# Patient Record
Sex: Male | Born: 1960
Health system: Southern US, Community
[De-identification: ages and names within clinical notes are randomized; demographics above are authoritative.]

## PROBLEM LIST (undated history)

## (undated) DIAGNOSIS — E119 Type 2 diabetes mellitus without complications: Secondary | ICD-10-CM

## (undated) DIAGNOSIS — K219 Gastro-esophageal reflux disease without esophagitis: Secondary | ICD-10-CM

## (undated) DIAGNOSIS — F419 Anxiety disorder, unspecified: Secondary | ICD-10-CM

## (undated) DIAGNOSIS — G20A1 Parkinson's disease without dyskinesia, without mention of fluctuations: Secondary | ICD-10-CM

## (undated) HISTORY — PX: COLONOSCOPY: SHX174

## (undated) HISTORY — PX: LASIK: SHX215

## (undated) HISTORY — DX: Type 2 diabetes mellitus without complications: E11.9

## (undated) HISTORY — PX: WISDOM TOOTH EXTRACTION: SHX21

---

## 2000-08-16 ENCOUNTER — Encounter: Admission: RE | Admit: 2000-08-16 | Discharge: 2000-11-14 | Payer: Self-pay | Admitting: Internal Medicine

## 2004-12-22 ENCOUNTER — Encounter: Admission: RE | Admit: 2004-12-22 | Discharge: 2004-12-22 | Payer: Self-pay | Admitting: Orthopedic Surgery

## 2007-10-12 ENCOUNTER — Encounter: Admission: RE | Admit: 2007-10-12 | Discharge: 2007-10-12 | Payer: Self-pay | Admitting: Internal Medicine

## 2014-01-31 ENCOUNTER — Other Ambulatory Visit: Payer: Self-pay | Admitting: Internal Medicine

## 2014-01-31 ENCOUNTER — Ambulatory Visit
Admission: RE | Admit: 2014-01-31 | Discharge: 2014-01-31 | Disposition: A | Discharge status: Home / Self Care | Payer: BC Managed Care – PPO | Source: Ambulatory Visit | Attending: Internal Medicine | Admitting: Internal Medicine

## 2014-01-31 DIAGNOSIS — M549 Dorsalgia, unspecified: Secondary | ICD-10-CM

## 2015-05-17 ENCOUNTER — Other Ambulatory Visit: Payer: Self-pay | Admitting: Internal Medicine

## 2015-05-17 ENCOUNTER — Ambulatory Visit
Admission: RE | Admit: 2015-05-17 | Discharge: 2015-05-17 | Disposition: A | Discharge status: Home / Self Care | Payer: 59 | Source: Ambulatory Visit | Attending: Internal Medicine | Admitting: Internal Medicine

## 2015-05-17 DIAGNOSIS — M79671 Pain in right foot: Secondary | ICD-10-CM

## 2016-02-17 DIAGNOSIS — E119 Type 2 diabetes mellitus without complications: Secondary | ICD-10-CM | POA: Diagnosis not present

## 2016-02-17 DIAGNOSIS — K219 Gastro-esophageal reflux disease without esophagitis: Secondary | ICD-10-CM | POA: Diagnosis not present

## 2016-02-17 DIAGNOSIS — G4700 Insomnia, unspecified: Secondary | ICD-10-CM | POA: Diagnosis not present

## 2016-02-17 DIAGNOSIS — J309 Allergic rhinitis, unspecified: Secondary | ICD-10-CM | POA: Diagnosis not present

## 2016-03-09 DIAGNOSIS — L720 Epidermal cyst: Secondary | ICD-10-CM | POA: Diagnosis not present

## 2016-03-09 DIAGNOSIS — L820 Inflamed seborrheic keratosis: Secondary | ICD-10-CM | POA: Diagnosis not present

## 2016-03-09 DIAGNOSIS — L570 Actinic keratosis: Secondary | ICD-10-CM | POA: Diagnosis not present

## 2016-07-06 DIAGNOSIS — E119 Type 2 diabetes mellitus without complications: Secondary | ICD-10-CM | POA: Diagnosis not present

## 2016-08-31 DIAGNOSIS — E119 Type 2 diabetes mellitus without complications: Secondary | ICD-10-CM | POA: Diagnosis not present

## 2016-08-31 DIAGNOSIS — Z125 Encounter for screening for malignant neoplasm of prostate: Secondary | ICD-10-CM | POA: Diagnosis not present

## 2016-08-31 DIAGNOSIS — Z1389 Encounter for screening for other disorder: Secondary | ICD-10-CM | POA: Diagnosis not present

## 2016-08-31 DIAGNOSIS — J309 Allergic rhinitis, unspecified: Secondary | ICD-10-CM | POA: Diagnosis not present

## 2016-08-31 DIAGNOSIS — Z1159 Encounter for screening for other viral diseases: Secondary | ICD-10-CM | POA: Diagnosis not present

## 2016-08-31 DIAGNOSIS — Z0000 Encounter for general adult medical examination without abnormal findings: Secondary | ICD-10-CM | POA: Diagnosis not present

## 2016-09-30 DIAGNOSIS — M542 Cervicalgia: Secondary | ICD-10-CM | POA: Diagnosis not present

## 2016-09-30 DIAGNOSIS — M5090 Cervical disc disorder, unspecified, unspecified cervical region: Secondary | ICD-10-CM | POA: Diagnosis not present

## 2016-10-13 ENCOUNTER — Other Ambulatory Visit: Payer: Self-pay | Admitting: Internal Medicine

## 2016-10-13 DIAGNOSIS — M47812 Spondylosis without myelopathy or radiculopathy, cervical region: Secondary | ICD-10-CM

## 2016-10-14 ENCOUNTER — Ambulatory Visit
Admission: RE | Admit: 2016-10-14 | Discharge: 2016-10-14 | Disposition: A | Discharge status: Home / Self Care | Payer: BLUE CROSS/BLUE SHIELD | Source: Ambulatory Visit | Attending: Internal Medicine | Admitting: Internal Medicine

## 2016-10-14 DIAGNOSIS — M47812 Spondylosis without myelopathy or radiculopathy, cervical region: Secondary | ICD-10-CM

## 2016-10-14 DIAGNOSIS — M50221 Other cervical disc displacement at C4-C5 level: Secondary | ICD-10-CM | POA: Diagnosis not present

## 2016-10-19 ENCOUNTER — Other Ambulatory Visit: Payer: Self-pay

## 2016-11-13 ENCOUNTER — Other Ambulatory Visit: Payer: Self-pay | Admitting: Neurosurgery

## 2016-11-13 DIAGNOSIS — M5020 Other cervical disc displacement, unspecified cervical region: Secondary | ICD-10-CM

## 2016-11-13 DIAGNOSIS — M4722 Other spondylosis with radiculopathy, cervical region: Secondary | ICD-10-CM | POA: Diagnosis not present

## 2016-11-19 ENCOUNTER — Ambulatory Visit
Admission: RE | Admit: 2016-11-19 | Discharge: 2016-11-19 | Disposition: A | Discharge status: Home / Self Care | Payer: BLUE CROSS/BLUE SHIELD | Source: Ambulatory Visit | Attending: Neurosurgery | Admitting: Neurosurgery

## 2016-11-19 DIAGNOSIS — M5020 Other cervical disc displacement, unspecified cervical region: Secondary | ICD-10-CM

## 2016-11-19 DIAGNOSIS — M47812 Spondylosis without myelopathy or radiculopathy, cervical region: Secondary | ICD-10-CM | POA: Diagnosis not present

## 2016-11-19 MED ORDER — TRIAMCINOLONE ACETONIDE 40 MG/ML IJ SUSP (RADIOLOGY)
60.0000 mg | Freq: Once | INTRAMUSCULAR | Status: AC
  Administered 2016-11-19: 60 mg via EPIDURAL

## 2016-11-19 MED ORDER — IOPAMIDOL (ISOVUE-M 300) INJECTION 61%
1.0000 mL | Freq: Once | INTRAMUSCULAR | Status: AC | PRN
Start: 2016-11-19 — End: 2016-11-19
  Administered 2016-11-19: 1 mL via EPIDURAL

## 2016-11-19 NOTE — Discharge Instructions (Signed)

## 2016-11-25 ENCOUNTER — Other Ambulatory Visit: Payer: BLUE CROSS/BLUE SHIELD

## 2016-12-04 DIAGNOSIS — J069 Acute upper respiratory infection, unspecified: Secondary | ICD-10-CM | POA: Diagnosis not present

## 2016-12-11 DIAGNOSIS — M5020 Other cervical disc displacement, unspecified cervical region: Secondary | ICD-10-CM | POA: Diagnosis not present

## 2017-03-01 DIAGNOSIS — F419 Anxiety disorder, unspecified: Secondary | ICD-10-CM | POA: Diagnosis not present

## 2017-03-01 DIAGNOSIS — M5090 Cervical disc disorder, unspecified, unspecified cervical region: Secondary | ICD-10-CM | POA: Diagnosis not present

## 2017-03-01 DIAGNOSIS — E119 Type 2 diabetes mellitus without complications: Secondary | ICD-10-CM | POA: Diagnosis not present

## 2017-03-01 DIAGNOSIS — K219 Gastro-esophageal reflux disease without esophagitis: Secondary | ICD-10-CM | POA: Diagnosis not present

## 2017-03-05 DIAGNOSIS — M4722 Other spondylosis with radiculopathy, cervical region: Secondary | ICD-10-CM | POA: Diagnosis not present

## 2017-03-29 IMAGING — MR MR CERVICAL SPINE W/O CM
4 of 5 series · 27 of 48 positions shown · non-contrast
Comparison: 12/22/2014

CLINICAL DATA: Osteoarthritis of the cervical spine.

EXAM:
MRI CERVICAL SPINE WITHOUT CONTRAST
TECHNIQUE: Multiplanar, multisequence MR imaging of the cervical spine was
performed. No intravenous contrast was administered.

[Series 6: T1 · sagittal · 3.0mm · 0.66mm/px · 6 of 15 slices shown]
[im 1/15]
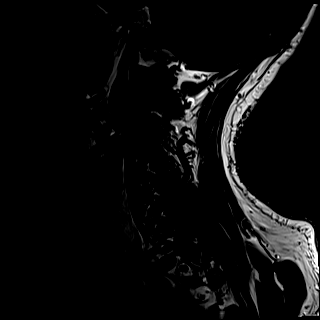
[im 3/15]
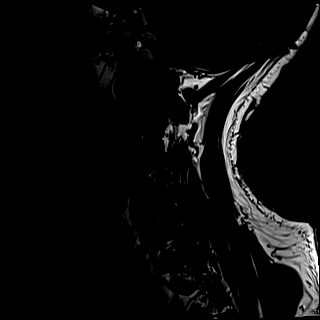
[im 6/15]
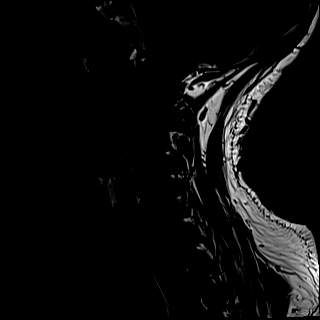
[im 9/15]
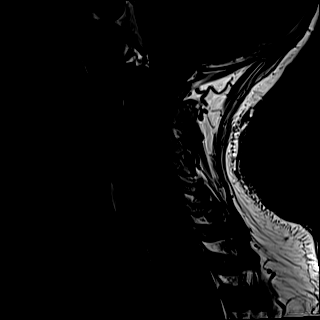
[im 12/15]
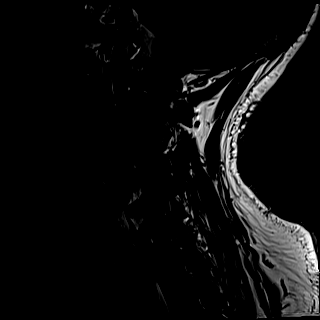
[im 15/15]
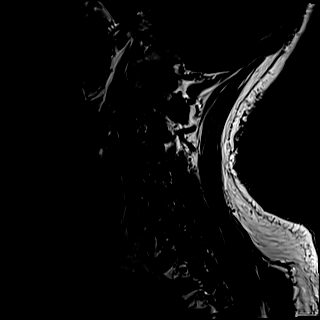

[Series 7: STIR · sagittal · 3.0mm · 0.33mm/px · 6 of 15 slices shown]
[im 1/15]
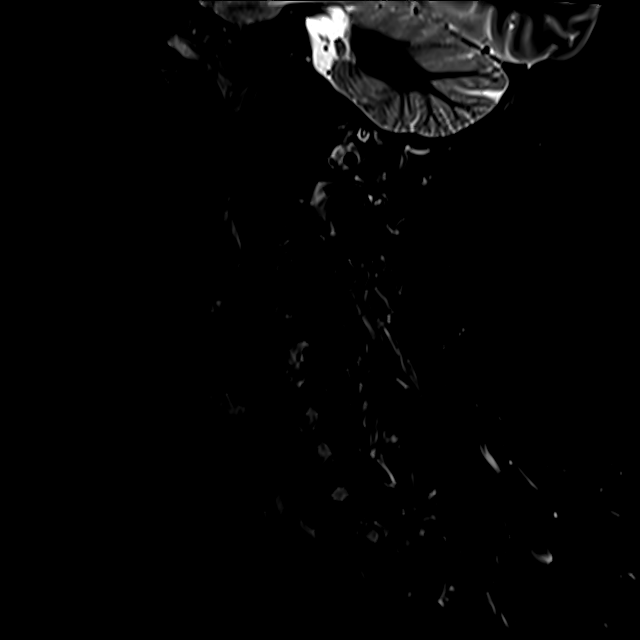
[im 3/15]
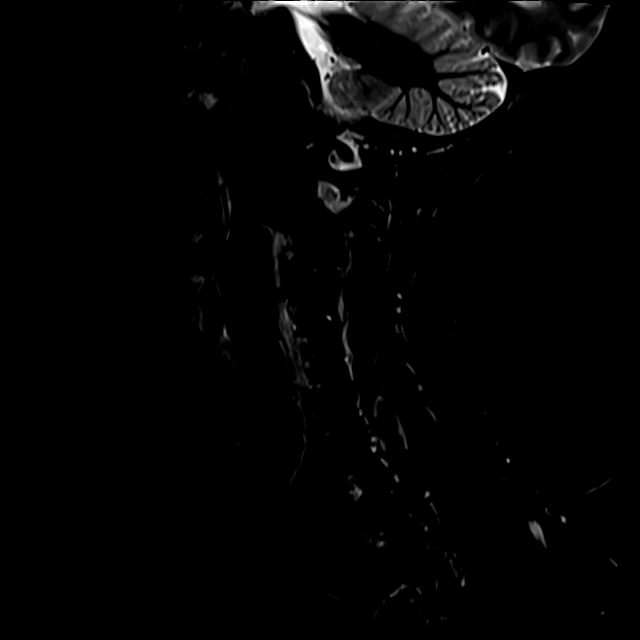
[im 5/15]
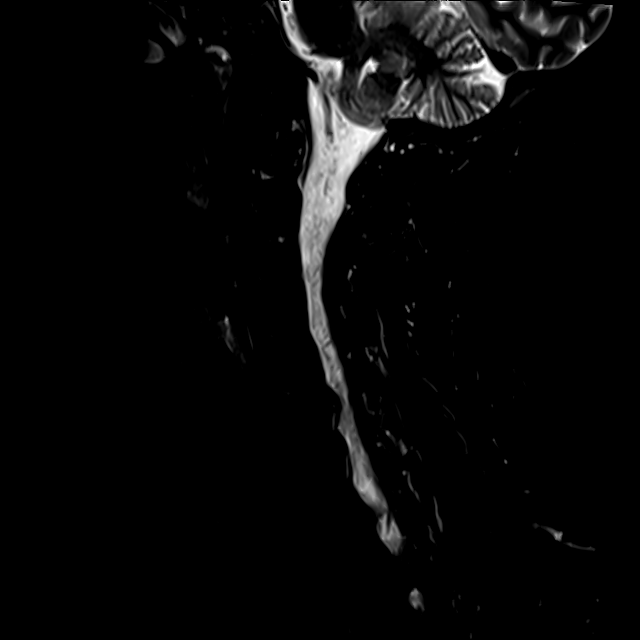
[im 8/15]
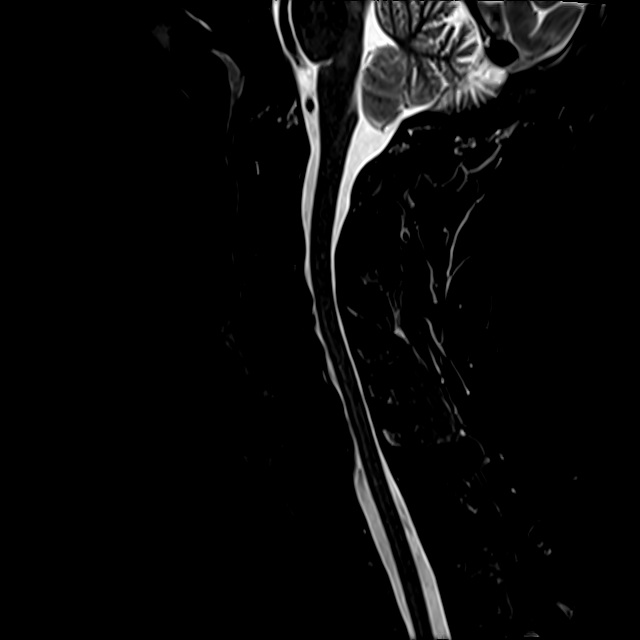
[im 10/15]
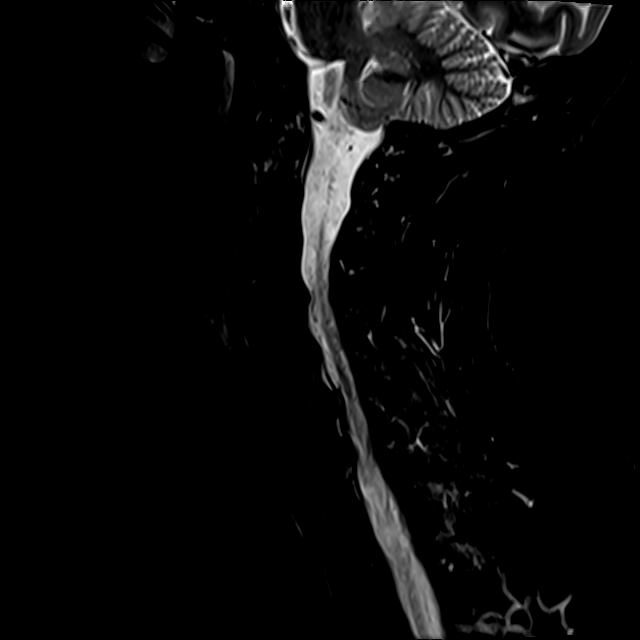
[im 12/15]
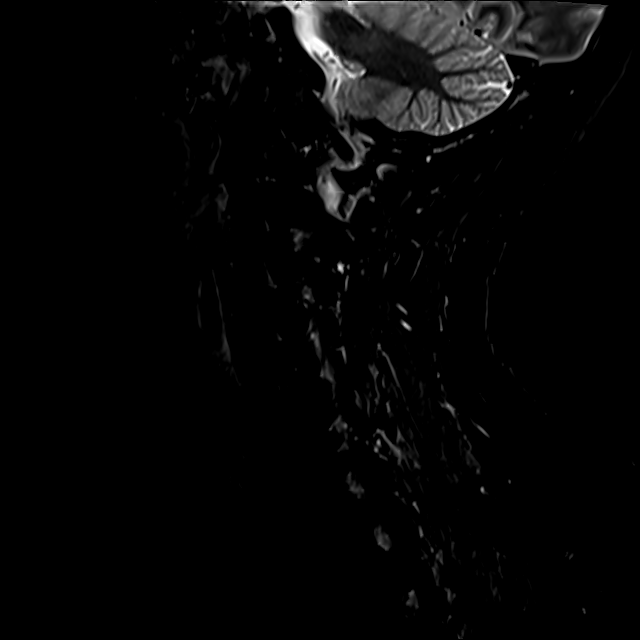

[Series 8: T2 · sagittal · 3.0mm · 0.55mm/px · 7 of 15 slices shown (1 of 2)]
[im 1/15]
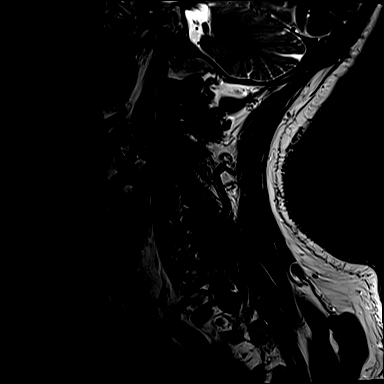
[im 3/15]
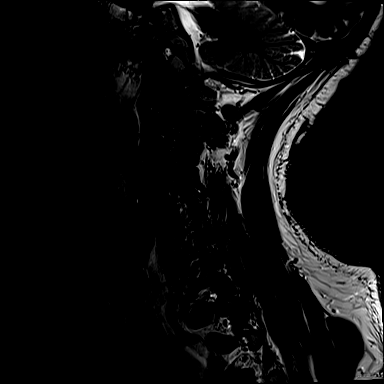
[im 5/15]
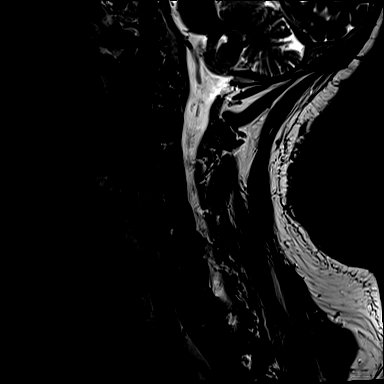
[im 8/15]
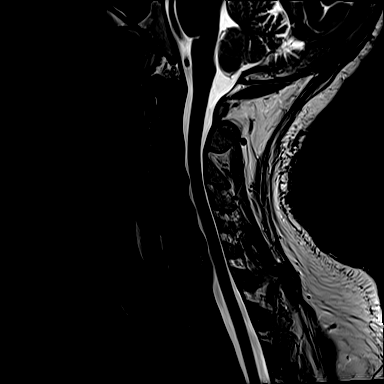
[im 10/15]
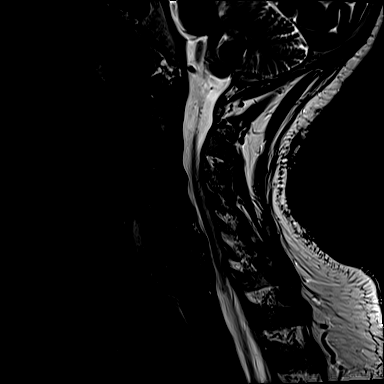
[im 12/15]
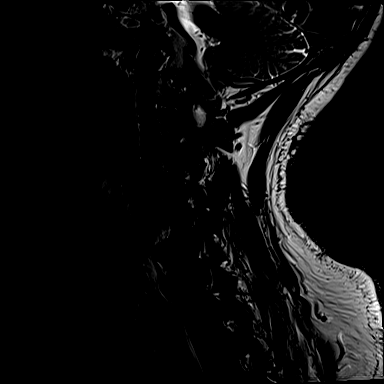
[im 15/15]
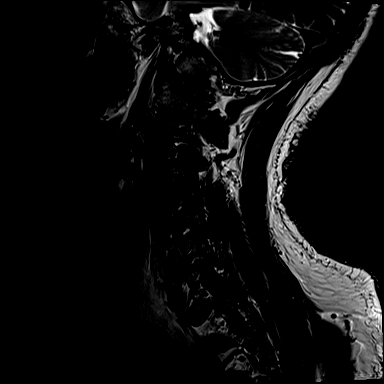

[Series 9: T2 · axial · 3.0mm · 0.50mm/px · z∈[-81,+12]mm · 8 of 32 slices shown (2 of 2)]
[im 1/32]
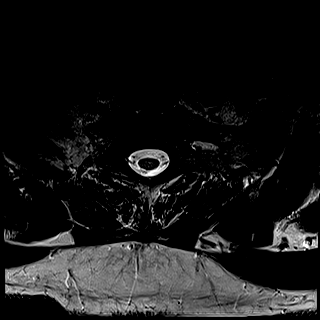
[im 5/32]
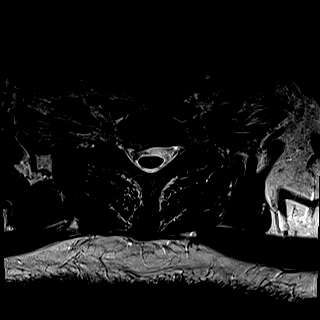
[im 10/32]
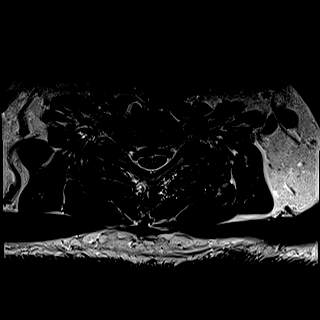
[im 15/32]
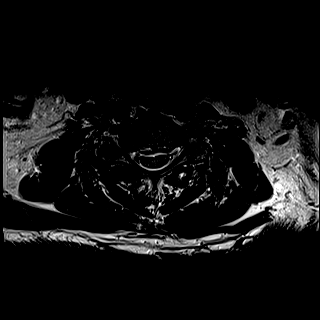
[im 17/32]
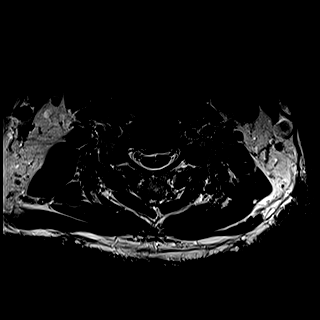
[im 22/32]
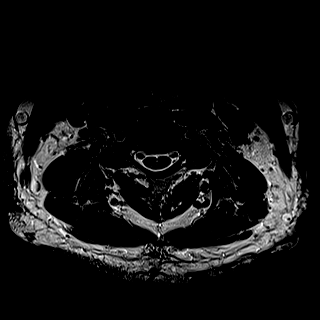
[im 27/32]
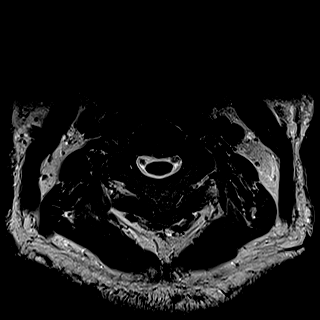
[im 32/32]
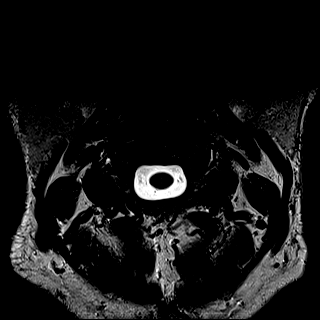

[27 of 48 positions shown; findings below may reference images not displayed]

FINDINGS: Alignment: Straightening without subluxation

Vertebrae: No fracture, evidence of discitis, or bone lesion.

Cord: Normal signal and morphology.

Posterior Fossa, vertebral arteries, paraspinal tissues: Negative.

Disc levels:

C2-3: Mild facet spurring.  Mild right foraminal stenosis.

C3-4: Disc narrowing and bulging with right more than left
uncovertebral spurs. Mild facet spurring. Mild borderline moderate
bilateral foraminal narrowing. Patent canal

C4-5: Disc narrowing and bulging with endplate ridging. Small right
paracentral protrusion, chronic. Small facet spurs. Mild right
foraminal narrowing, stable.

C5-6: Disc narrowing and ridging with chronic right paracentral
protrusion mildly flattening the right cord. Moderate left and
advanced right foraminal stenosis from uncovertebral ridging,
progressed.

C6-7: Disc narrowing and spurring with down turning right
paracentral protrusion, chronic. Mild flattening of the right
ventral cord. Right more than left uncovertebral ridging. Advanced
bilateral foraminal stenosis, progressed.

C7-T1:Unremarkable.
IMPRESSION: 1. Disc degeneration with mild progression compared to 5990.
2. C4-5 right paracentral disc protrusion with mild ventral cord
flattening. Moderate right foraminal stenosis.
3. C5-6 disc osteophyte complex and right paracentral protrusion
with mild ventral cord flattening. Progressed bilateral foraminal
stenosis with C6 impingement greater on the right.
4. C6-7 right paracentral disc protrusion with mild ventral cord
flattening. Progressed bilateral foraminal stenosis with C7
impingement greater on the right.

## 2017-04-15 ENCOUNTER — Encounter: Payer: BLUE CROSS/BLUE SHIELD | Attending: Internal Medicine | Admitting: Registered"

## 2017-04-15 ENCOUNTER — Encounter: Payer: Self-pay | Admitting: Registered"

## 2017-04-15 DIAGNOSIS — E1165 Type 2 diabetes mellitus with hyperglycemia: Secondary | ICD-10-CM | POA: Diagnosis not present

## 2017-04-15 DIAGNOSIS — E119 Type 2 diabetes mellitus without complications: Secondary | ICD-10-CM

## 2017-04-15 DIAGNOSIS — Z713 Dietary counseling and surveillance: Secondary | ICD-10-CM | POA: Insufficient documentation

## 2017-04-15 NOTE — Progress Notes (Signed)
Diabetes Self-Management Education  Visit Type: First/Initial  Appt. Start Time: 1605 Appt. End Time: 1730  04/15/2017  [name], identified by name and date of birth, is a 56 y.o. male with a diagnosis of Diabetes: Type 2.   ASSESSMENT Patient has made some significant changes since getting the diabetes diagnosis. Patients states he had a sweet tooth and would regularly eat things like a large pack of M&Ms or milk shakes. Pt reports he has not had any since his diagnosis.  Although pt states he does not get structured physical activity, he reports he walks a lot in his work usually up to 2 mi per day, plus he uses stairs instead of elevators as a rule. 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 Pt's spouse added that they are planning to start walking the dog in the evenings.     Diabetes Self-Management Education - 04/15/17 1603      Visit Information   Visit Type First/Initial     Initial Visit   Diabetes Type Type 2   Are you currently following a meal plan? No   Are you taking your medications as prescribed? Yes   Date Diagnosed 2016     Health Coping   How would you rate your overall health? Good     Psychosocial Assessment   Patient Belief/Attitude about Diabetes Motivated to manage diabetes   Other persons present Patient;Spouse/SO   Learning Readiness Change in progress   What is the last grade level you completed in school? BSBA     Complications   Last HgB A1C per patient/outside source 8 %  per patient   How often do you check your blood sugar? 0 times/day (not testing)   Have you had a dilated eye exam in the past 12 months? Yes   Have you had a dental exam in the past 12 months? Yes   Are you checking your feet? No     Dietary Intake   Breakfast apple, coffee with lactose fat fre milk OR protein bar   Snack (morning) none   Lunch travels a lot (eating out) may go to [employer] for grilled chicken sandwich   Snack (afternoon) protein bar, string  cheese   Dinner grilled chicken, pork loin, green beans, corn, mixed veggies, beans   Snack (evening) cheese or Pro bar, nuts   Beverage(s) 4 c milk, 40-62 oz water, 20 oz 1/2 sweet 1/2 unsweet tea, diet soft, G2 gatorade, coffee      Exercise   Exercise Type ADL's   How many days per week to you exercise? 0   How many minutes per day do you exercise? 0   Total minutes per week of exercise 0     Patient Education   Previous Diabetes Education No   Disease state  Definition of diabetes, type 1 and 2, and the diagnosis of diabetes;Factors that contribute to the development of diabetes   Nutrition management  Role of diet in the treatment of diabetes and the relationship between the three main macronutrients and blood glucose level;Food label reading, portion sizes and measuring food.;Carbohydrate counting;Reviewed blood glucose goals for pre and post meals and how to evaluate the patients' food intake on their blood glucose level.;Effects of alcohol on blood glucose and safety factors with consumption of alcohol.   Physical activity and exercise  Role of exercise on diabetes management, blood pressure control and cardiac health.   Medications Reviewed patients medication for diabetes, action, purpose, timing  of dose and side effects.   Monitoring Identified appropriate SMBG and/or A1C goals.;Purpose and frequency of SMBG.   Chronic complications Relationship between chronic complications and blood glucose control   Psychosocial adjustment Role of stress on diabetes     Individualized Goals (developed by patient)   Nutrition Follow meal plan discussed   Physical Activity Exercise 5-7 days per week     Outcomes   Expected Outcomes Demonstrated interest in learning. Expect positive outcomes   Future DMSE PRN   Program Status Completed      Individualized Plan for Diabetes Self-Management Training:   Learning Objective:  Patient will have a greater understanding of diabetes  self-management. Patient education plan is to attend individual and/or group sessions per assessed needs and concerns.  Patient Instructions  [email or web address] Fair Life milk has fewer carbs if you want to lower carbs with milk  Plan:  Aim for 3-4 Carb Choices per meal (45-60 grams)   Aim for 0-2 Carbs per snack if hungry  Include protein with your meals and snacks Consider reading food labels for Total Carbohydrate and Sat Fat Grams of foods Consider including some activity which helps you de-stress such as walking the dog after dinner Consider checking BG at alternate times per day as directed by MD * Consider starting with checking 2 times a day for a few days to see how your changes are affecting your blood sugar - once fasting and another time 2 hrs after a meal Continue taking medication as directed by MD  Expected Outcomes:  Demonstrated interest in learning. Expect positive outcomes  Education material provided: Living Well with Diabetes, A1C conversion sheet, My Plate, Snack sheet and Carbohydrate counting sheet  If problems or questions, patient to contact team via:  Phone and MyChart  Future DSME appointment: PRN

## 2017-04-15 NOTE — Patient Instructions (Addendum)
[email or web address] Fair Life milk has fewer carbs if you want to lower carbs with milk  Plan:  Aim for 3-4 Carb Choices per meal (45-60 grams)   Aim for 0-2 Carbs per snack if hungry  Include protein with your meals and snacks Consider reading food labels for Total Carbohydrate and Sat Fat Grams of foods Consider including some activity which helps you de-stress such as walking the dog after dinner Consider checking BG at alternate times per day as directed by MD * Consider starting with checking 2 times a day for a few days to see how your changes are affecting your blood sugar - once fasting and another time 2 hrs after a meal Continue taking medication as directed by MD

## 2017-04-26 ENCOUNTER — Other Ambulatory Visit: Payer: Self-pay | Admitting: Nurse Practitioner

## 2017-04-26 DIAGNOSIS — M4722 Other spondylosis with radiculopathy, cervical region: Secondary | ICD-10-CM

## 2017-05-07 ENCOUNTER — Ambulatory Visit
Admission: RE | Admit: 2017-05-07 | Discharge: 2017-05-07 | Disposition: A | Discharge status: Home / Self Care | Payer: BLUE CROSS/BLUE SHIELD | Source: Ambulatory Visit | Attending: Nurse Practitioner | Admitting: Nurse Practitioner

## 2017-05-07 DIAGNOSIS — M47812 Spondylosis without myelopathy or radiculopathy, cervical region: Secondary | ICD-10-CM | POA: Diagnosis not present

## 2017-05-07 DIAGNOSIS — M4722 Other spondylosis with radiculopathy, cervical region: Secondary | ICD-10-CM

## 2017-05-07 MED ORDER — TRIAMCINOLONE ACETONIDE 40 MG/ML IJ SUSP (RADIOLOGY)
60.0000 mg | Freq: Once | INTRAMUSCULAR | Status: AC
  Administered 2017-05-07: 60 mg via EPIDURAL

## 2017-05-07 MED ORDER — IOPAMIDOL (ISOVUE-M 300) INJECTION 61%
1.0000 mL | Freq: Once | INTRAMUSCULAR | Status: AC | PRN
  Administered 2017-05-07: 1 mL via EPIDURAL

## 2017-05-07 NOTE — Discharge Instructions (Signed)

## 2017-07-12 DIAGNOSIS — E119 Type 2 diabetes mellitus without complications: Secondary | ICD-10-CM | POA: Diagnosis not present

## 2017-07-12 DIAGNOSIS — H524 Presbyopia: Secondary | ICD-10-CM | POA: Diagnosis not present

## 2017-07-12 DIAGNOSIS — H5203 Hypermetropia, bilateral: Secondary | ICD-10-CM | POA: Diagnosis not present

## 2017-09-06 DIAGNOSIS — Z1389 Encounter for screening for other disorder: Secondary | ICD-10-CM | POA: Diagnosis not present

## 2017-09-06 DIAGNOSIS — Z23 Encounter for immunization: Secondary | ICD-10-CM | POA: Diagnosis not present

## 2017-09-06 DIAGNOSIS — E1165 Type 2 diabetes mellitus with hyperglycemia: Secondary | ICD-10-CM | POA: Diagnosis not present

## 2017-09-06 DIAGNOSIS — Z125 Encounter for screening for malignant neoplasm of prostate: Secondary | ICD-10-CM | POA: Diagnosis not present

## 2017-09-06 DIAGNOSIS — Z13220 Encounter for screening for lipoid disorders: Secondary | ICD-10-CM | POA: Diagnosis not present

## 2017-09-06 DIAGNOSIS — Z0000 Encounter for general adult medical examination without abnormal findings: Secondary | ICD-10-CM | POA: Diagnosis not present

## 2017-12-10 DIAGNOSIS — L0232 Furuncle of buttock: Secondary | ICD-10-CM | POA: Diagnosis not present

## 2018-01-13 DIAGNOSIS — N5201 Erectile dysfunction due to arterial insufficiency: Secondary | ICD-10-CM | POA: Diagnosis not present

## 2018-01-13 DIAGNOSIS — N486 Induration penis plastica: Secondary | ICD-10-CM | POA: Diagnosis not present

## 2018-01-13 DIAGNOSIS — Z125 Encounter for screening for malignant neoplasm of prostate: Secondary | ICD-10-CM | POA: Diagnosis not present

## 2018-01-19 ENCOUNTER — Other Ambulatory Visit: Payer: Self-pay | Admitting: Nurse Practitioner

## 2018-01-19 DIAGNOSIS — M5412 Radiculopathy, cervical region: Secondary | ICD-10-CM

## 2018-02-07 DIAGNOSIS — T148XXA Other injury of unspecified body region, initial encounter: Secondary | ICD-10-CM | POA: Diagnosis not present

## 2018-02-07 DIAGNOSIS — Z23 Encounter for immunization: Secondary | ICD-10-CM | POA: Diagnosis not present

## 2018-02-11 ENCOUNTER — Other Ambulatory Visit: Payer: BLUE CROSS/BLUE SHIELD

## 2018-03-11 ENCOUNTER — Other Ambulatory Visit: Payer: BLUE CROSS/BLUE SHIELD

## 2018-03-28 ENCOUNTER — Ambulatory Visit
Admission: RE | Admit: 2018-03-28 | Discharge: 2018-03-28 | Disposition: A | Discharge status: Home / Self Care | Payer: BLUE CROSS/BLUE SHIELD | Source: Ambulatory Visit | Attending: Nurse Practitioner | Admitting: Nurse Practitioner

## 2018-03-28 DIAGNOSIS — M5412 Radiculopathy, cervical region: Secondary | ICD-10-CM | POA: Diagnosis not present

## 2018-03-28 MED ORDER — IOPAMIDOL (ISOVUE-M 300) INJECTION 61%
1.0000 mL | Freq: Once | INTRAMUSCULAR | Status: AC | PRN
Start: 2018-03-28 — End: 2018-03-28
  Administered 2018-03-28: 1 mL via EPIDURAL

## 2018-03-28 MED ORDER — TRIAMCINOLONE ACETONIDE 40 MG/ML IJ SUSP (RADIOLOGY)
60.0000 mg | Freq: Once | INTRAMUSCULAR | Status: AC
  Administered 2018-03-28: 60 mg via EPIDURAL

## 2018-04-01 DIAGNOSIS — L918 Other hypertrophic disorders of the skin: Secondary | ICD-10-CM | POA: Diagnosis not present

## 2018-04-01 DIAGNOSIS — L723 Sebaceous cyst: Secondary | ICD-10-CM | POA: Diagnosis not present

## 2018-04-21 DIAGNOSIS — L720 Epidermal cyst: Secondary | ICD-10-CM | POA: Diagnosis not present

## 2018-04-22 DIAGNOSIS — M5412 Radiculopathy, cervical region: Secondary | ICD-10-CM | POA: Diagnosis not present

## 2018-04-22 DIAGNOSIS — M4722 Other spondylosis with radiculopathy, cervical region: Secondary | ICD-10-CM | POA: Diagnosis not present

## 2018-04-22 DIAGNOSIS — Z6826 Body mass index (BMI) 26.0-26.9, adult: Secondary | ICD-10-CM | POA: Diagnosis not present

## 2018-05-02 DIAGNOSIS — E782 Mixed hyperlipidemia: Secondary | ICD-10-CM | POA: Diagnosis not present

## 2018-05-02 DIAGNOSIS — F419 Anxiety disorder, unspecified: Secondary | ICD-10-CM | POA: Diagnosis not present

## 2018-05-02 DIAGNOSIS — N529 Male erectile dysfunction, unspecified: Secondary | ICD-10-CM | POA: Diagnosis not present

## 2018-05-02 DIAGNOSIS — K219 Gastro-esophageal reflux disease without esophagitis: Secondary | ICD-10-CM | POA: Diagnosis not present

## 2018-05-02 DIAGNOSIS — Z79899 Other long term (current) drug therapy: Secondary | ICD-10-CM | POA: Diagnosis not present

## 2018-05-02 DIAGNOSIS — E1169 Type 2 diabetes mellitus with other specified complication: Secondary | ICD-10-CM | POA: Diagnosis not present

## 2018-08-22 DIAGNOSIS — E119 Type 2 diabetes mellitus without complications: Secondary | ICD-10-CM | POA: Diagnosis not present

## 2018-08-22 DIAGNOSIS — Z7984 Long term (current) use of oral hypoglycemic drugs: Secondary | ICD-10-CM | POA: Diagnosis not present

## 2018-08-22 DIAGNOSIS — H2513 Age-related nuclear cataract, bilateral: Secondary | ICD-10-CM | POA: Diagnosis not present

## 2018-08-22 DIAGNOSIS — H5203 Hypermetropia, bilateral: Secondary | ICD-10-CM | POA: Diagnosis not present

## 2018-09-30 DIAGNOSIS — Z23 Encounter for immunization: Secondary | ICD-10-CM | POA: Diagnosis not present

## 2018-09-30 DIAGNOSIS — Z125 Encounter for screening for malignant neoplasm of prostate: Secondary | ICD-10-CM | POA: Diagnosis not present

## 2018-09-30 DIAGNOSIS — Z1389 Encounter for screening for other disorder: Secondary | ICD-10-CM | POA: Diagnosis not present

## 2018-09-30 DIAGNOSIS — Z0000 Encounter for general adult medical examination without abnormal findings: Secondary | ICD-10-CM | POA: Diagnosis not present

## 2018-09-30 DIAGNOSIS — E1169 Type 2 diabetes mellitus with other specified complication: Secondary | ICD-10-CM | POA: Diagnosis not present

## 2019-01-19 DIAGNOSIS — L989 Disorder of the skin and subcutaneous tissue, unspecified: Secondary | ICD-10-CM | POA: Diagnosis not present

## 2019-02-27 DIAGNOSIS — L720 Epidermal cyst: Secondary | ICD-10-CM | POA: Diagnosis not present

## 2019-02-27 DIAGNOSIS — M7130 Other bursal cyst, unspecified site: Secondary | ICD-10-CM | POA: Diagnosis not present

## 2019-03-30 DIAGNOSIS — J309 Allergic rhinitis, unspecified: Secondary | ICD-10-CM | POA: Diagnosis not present

## 2019-03-30 DIAGNOSIS — F419 Anxiety disorder, unspecified: Secondary | ICD-10-CM | POA: Diagnosis not present

## 2019-03-30 DIAGNOSIS — K219 Gastro-esophageal reflux disease without esophagitis: Secondary | ICD-10-CM | POA: Diagnosis not present

## 2019-03-30 DIAGNOSIS — E1169 Type 2 diabetes mellitus with other specified complication: Secondary | ICD-10-CM | POA: Diagnosis not present

## 2019-04-03 DIAGNOSIS — E1165 Type 2 diabetes mellitus with hyperglycemia: Secondary | ICD-10-CM | POA: Diagnosis not present

## 2019-04-03 DIAGNOSIS — E1169 Type 2 diabetes mellitus with other specified complication: Secondary | ICD-10-CM | POA: Diagnosis not present

## 2019-04-04 DIAGNOSIS — R21 Rash and other nonspecific skin eruption: Secondary | ICD-10-CM | POA: Diagnosis not present

## 2019-08-28 DIAGNOSIS — H25813 Combined forms of age-related cataract, bilateral: Secondary | ICD-10-CM | POA: Diagnosis not present

## 2019-08-28 DIAGNOSIS — H524 Presbyopia: Secondary | ICD-10-CM | POA: Diagnosis not present

## 2019-08-28 DIAGNOSIS — H5203 Hypermetropia, bilateral: Secondary | ICD-10-CM | POA: Diagnosis not present

## 2019-08-28 DIAGNOSIS — E119 Type 2 diabetes mellitus without complications: Secondary | ICD-10-CM | POA: Diagnosis not present

## 2019-09-18 DIAGNOSIS — L720 Epidermal cyst: Secondary | ICD-10-CM | POA: Diagnosis not present

## 2019-09-22 DIAGNOSIS — Z125 Encounter for screening for malignant neoplasm of prostate: Secondary | ICD-10-CM | POA: Diagnosis not present

## 2019-09-22 DIAGNOSIS — Z1389 Encounter for screening for other disorder: Secondary | ICD-10-CM | POA: Diagnosis not present

## 2019-09-22 DIAGNOSIS — E1169 Type 2 diabetes mellitus with other specified complication: Secondary | ICD-10-CM | POA: Diagnosis not present

## 2019-09-22 DIAGNOSIS — E785 Hyperlipidemia, unspecified: Secondary | ICD-10-CM | POA: Diagnosis not present

## 2019-09-22 DIAGNOSIS — K2100 Gastro-esophageal reflux disease with esophagitis, without bleeding: Secondary | ICD-10-CM | POA: Diagnosis not present

## 2019-09-22 DIAGNOSIS — N529 Male erectile dysfunction, unspecified: Secondary | ICD-10-CM | POA: Diagnosis not present

## 2019-11-23 DIAGNOSIS — L720 Epidermal cyst: Secondary | ICD-10-CM | POA: Diagnosis not present

## 2020-03-22 DIAGNOSIS — F419 Anxiety disorder, unspecified: Secondary | ICD-10-CM | POA: Diagnosis not present

## 2020-03-22 DIAGNOSIS — E782 Mixed hyperlipidemia: Secondary | ICD-10-CM | POA: Diagnosis not present

## 2020-03-22 DIAGNOSIS — E1169 Type 2 diabetes mellitus with other specified complication: Secondary | ICD-10-CM | POA: Diagnosis not present

## 2020-03-22 DIAGNOSIS — G4700 Insomnia, unspecified: Secondary | ICD-10-CM | POA: Diagnosis not present

## 2020-04-18 DIAGNOSIS — R05 Cough: Secondary | ICD-10-CM | POA: Diagnosis not present

## 2020-04-18 DIAGNOSIS — J019 Acute sinusitis, unspecified: Secondary | ICD-10-CM | POA: Diagnosis not present

## 2020-04-25 DIAGNOSIS — M5412 Radiculopathy, cervical region: Secondary | ICD-10-CM | POA: Diagnosis not present

## 2020-04-25 DIAGNOSIS — M4802 Spinal stenosis, cervical region: Secondary | ICD-10-CM | POA: Diagnosis not present

## 2020-04-25 DIAGNOSIS — M5020 Other cervical disc displacement, unspecified cervical region: Secondary | ICD-10-CM | POA: Diagnosis not present

## 2020-08-06 DIAGNOSIS — Z1159 Encounter for screening for other viral diseases: Secondary | ICD-10-CM | POA: Diagnosis not present

## 2020-08-09 DIAGNOSIS — D123 Benign neoplasm of transverse colon: Secondary | ICD-10-CM | POA: Diagnosis not present

## 2020-08-09 DIAGNOSIS — Z1211 Encounter for screening for malignant neoplasm of colon: Secondary | ICD-10-CM | POA: Diagnosis not present

## 2020-08-09 DIAGNOSIS — K5730 Diverticulosis of large intestine without perforation or abscess without bleeding: Secondary | ICD-10-CM | POA: Diagnosis not present

## 2020-08-09 DIAGNOSIS — K635 Polyp of colon: Secondary | ICD-10-CM | POA: Diagnosis not present

## 2020-10-01 DIAGNOSIS — Z125 Encounter for screening for malignant neoplasm of prostate: Secondary | ICD-10-CM | POA: Diagnosis not present

## 2020-10-01 DIAGNOSIS — Z23 Encounter for immunization: Secondary | ICD-10-CM | POA: Diagnosis not present

## 2020-10-01 DIAGNOSIS — Z0000 Encounter for general adult medical examination without abnormal findings: Secondary | ICD-10-CM | POA: Diagnosis not present

## 2020-10-01 DIAGNOSIS — E785 Hyperlipidemia, unspecified: Secondary | ICD-10-CM | POA: Diagnosis not present

## 2020-10-01 DIAGNOSIS — E1169 Type 2 diabetes mellitus with other specified complication: Secondary | ICD-10-CM | POA: Diagnosis not present

## 2021-01-28 DIAGNOSIS — R208 Other disturbances of skin sensation: Secondary | ICD-10-CM | POA: Diagnosis not present

## 2021-01-28 DIAGNOSIS — L720 Epidermal cyst: Secondary | ICD-10-CM | POA: Diagnosis not present

## 2021-01-28 DIAGNOSIS — M7130 Other bursal cyst, unspecified site: Secondary | ICD-10-CM | POA: Diagnosis not present

## 2021-01-28 DIAGNOSIS — L853 Xerosis cutis: Secondary | ICD-10-CM | POA: Diagnosis not present

## 2021-02-06 DIAGNOSIS — L720 Epidermal cyst: Secondary | ICD-10-CM | POA: Diagnosis not present

## 2021-02-20 DIAGNOSIS — E1169 Type 2 diabetes mellitus with other specified complication: Secondary | ICD-10-CM | POA: Diagnosis not present

## 2021-02-20 DIAGNOSIS — E782 Mixed hyperlipidemia: Secondary | ICD-10-CM | POA: Diagnosis not present

## 2021-02-20 DIAGNOSIS — N529 Male erectile dysfunction, unspecified: Secondary | ICD-10-CM | POA: Diagnosis not present

## 2021-02-20 DIAGNOSIS — Z23 Encounter for immunization: Secondary | ICD-10-CM | POA: Diagnosis not present

## 2021-02-20 DIAGNOSIS — K219 Gastro-esophageal reflux disease without esophagitis: Secondary | ICD-10-CM | POA: Diagnosis not present

## 2021-03-19 DIAGNOSIS — Z20822 Contact with and (suspected) exposure to covid-19: Secondary | ICD-10-CM | POA: Diagnosis not present

## 2021-06-11 DIAGNOSIS — L720 Epidermal cyst: Secondary | ICD-10-CM | POA: Diagnosis not present

## 2021-09-12 DIAGNOSIS — E1169 Type 2 diabetes mellitus with other specified complication: Secondary | ICD-10-CM | POA: Diagnosis not present

## 2021-09-12 DIAGNOSIS — E782 Mixed hyperlipidemia: Secondary | ICD-10-CM | POA: Diagnosis not present

## 2021-09-12 DIAGNOSIS — R202 Paresthesia of skin: Secondary | ICD-10-CM | POA: Diagnosis not present

## 2021-09-12 DIAGNOSIS — Z23 Encounter for immunization: Secondary | ICD-10-CM | POA: Diagnosis not present

## 2021-09-12 DIAGNOSIS — F419 Anxiety disorder, unspecified: Secondary | ICD-10-CM | POA: Diagnosis not present

## 2021-09-12 DIAGNOSIS — Z0000 Encounter for general adult medical examination without abnormal findings: Secondary | ICD-10-CM | POA: Diagnosis not present

## 2021-09-15 ENCOUNTER — Encounter: Payer: Self-pay | Admitting: Neurology

## 2021-09-23 NOTE — Progress Notes (Signed)
Assessment/Plan:   1.  Akinetic rigid Parkinson's disease, new diagnosis  -We discussed the diagnosis as well as pathophysiology of the disease.  We discussed treatment options as well as prognostic indicators.  Patient education was provided.  -We discussed medication and surgical options, but the patient was just very overwhelmed with the diagnosis.  After some discussion, it became clear that it was not a good idea to continue and I felt that we needed to make a follow-up appointment in a few weeks, when perhaps his wife could attend the appointment.  -Expressed several times to the patient that this diagnosis does not change his life span.  -Expressed several times to the patient that putting him on medications likely would make him feel much better than he does today.  Discussed the classic honeymoon period.  -We discussed community resources in the area including patient support groups and community exercise programs for PD and pt education was provided to the patient.  He met with my LCSW today.  2.  Sialorrhea  -This is commonly associated with PD.  We talked about treatments.  The patient is not a candidate for oral anticholinergic therapy because of increased risk of confusion and falls.  We discussed Botox (type A and B) and 1% atropine drops.  We discusssed that candy like lemon drops can help by stimulating mm of the oropharynx to induce swallowing.  I asked him to think about those things and he can let me know if he wants to pursue Botox.  He does work, so the drooling can certainly be an embarrassment.    Subjective:   Robert Mcclain was seen today in the movement disorders clinic for neurologic consultation at the request of Wenda Low, MD.  The consultation is for the evaluation of gait instability (pt reports "I just occasionally stumble), tremor, drooling and to rule out Parkinson's disease.  Medical records made available to me are reviewed.Marland Kitchen   Specific Symptoms:   Tremor: No. Per pt (yes per records) Family hx of similar:  No. Voice: no change Sleep: some trouble sleeping as "my job is stressful."  Takes xanax at bed   Vivid Dreams:  No.  Acting out dreams:  No. (Did when took [place] years ago) Wet Pillows: No. Postural symptoms:  Yes.  , occasionally a "stumble" but never a fall  Falls?  No. Bradykinesia symptoms:  " a little shuffle" (states that his progressive lenses have affected gait some); states that pace of walking has not chaged Loss of smell:  Yes.   Loss of taste:  No. Urinary Incontinence:  No. Difficulty Swallowing:  No. Handwriting, micrographia: Yes.   Trouble with ADL's:  No.  Trouble buttoning clothing: Yes.   (Trouble buttoning with the R hand and trouble using the mouse on the R - attributes to arthritis on the R - he is R hand dominant) Depression:  No., admits to anxiety Memory changes:  Yes.  , mild (but still able to work in [employer] and remember names of customers and do job) Hallucinations:  No.  visual distortions: Yes.   (Only when has on progressive lenses on) N/V:  No. Lightheaded:  Yes.  , if stands too quickly (if bends to put golf tee in ground)  Syncope: No. Diplopia:  No. Dyskinesia:  No. Prior exposure to reglan/antipsychotics: No.  Neuroimaging of the brain has not previously been performed.    PREVIOUS MEDICATIONS: none to date  ALLERGIES:  No Known Allergies  CURRENT MEDICATIONS:  Current Outpatient Medications  Medication Instructions   acetaminophen (TYLENOL) 650 mg, Oral, Every 6 hours PRN   ALPRAZolam (XANAX) 0.5 mg, Oral, At bedtime PRN   b complex vitamins capsule 1 capsule, Oral, Daily   busPIRone (BUSPAR) 5 mg, Oral, 2 times daily   diclofenac (VOLTAREN) 50 mg, Oral, 2 times daily   finasteride (PROSCAR) 5 mg, Oral, Daily   fluticasone (FLONASE) 50 MCG/ACT nasal spray Each Nare, Daily   ibuprofen (ADVIL) 200 mg, Oral, Every 6 hours PRN   Krill Oil 500 MG CAPS Oral   loratadine  (CLARITIN) 10 mg, Oral, Daily   metFORMIN (GLUCOPHAGE) 850 mg, Oral, 2 times daily with meals   Multiple Vitamin (MULTIVITAMIN) tablet 1 tablet, Oral, Daily   sildenafil (VIAGRA) 100 mg, Oral, Daily PRN   simvastatin (ZOCOR) 10 MG tablet SMARTSIG:1 Tablet(s) By Mouth Every Evening   vitamin C (ASCORBIC ACID) 500 mg, Oral, Daily    Objective:   VITALS:   Vitals:   09/25/21 1325  BP: 140/72  Pulse: 75  SpO2: 99%  Weight: 180 lb (81.6 kg)  Height: 5' 10.5" (1.791 m)    GEN:  The patient appears stated age and is in NAD. HEENT:  Normocephalic, atraumatic.  The mucous membranes are moist. The superficial temporal arteries are without ropiness or tenderness. CV:  RRR Lungs:  CTAB Neck/HEME:  There are no carotid bruits bilaterally.  Neurological examination:  Orientation: The patient is alert and oriented x3.  Cranial nerves: There is good facial symmetry. There is facial hypomimia with lips parted.  Extraocular muscles are intact. The visual fields are full to confrontational testing. The speech is fluent and clear. Soft palate rises symmetrically and there is no tongue deviation. Hearing is intact to conversational tone. Sensation: Sensation is intact to light and pinprick throughout (facial, trunk, extremities). Vibration is intact at the bilateral big toe. There is no extinction with double simultaneous stimulation. There is no sensory dermatomal level identified. Motor: Strength is 5/5 in the bilateral upper and lower extremities.   Shoulder shrug is equal and symmetric.  There is no pronator drift. Deep tendon reflexes: Deep tendon reflexes are 2/4 at the bilateral biceps, triceps, brachioradialis, patella and achilles. Plantar responses are downgoing bilaterally.  Movement examination: Tone: There is mod increased tone in the L>RUE Abnormal movements: none even with distraction procedures Coordination:  There is  decremation with RAM's, with any form of RAMS, including  alternating supination and pronation of the forearm, hand opening and closing, finger taps, heel taps and toe taps, L>R, UE>LE Gait and Station: The patient has minimal difficulty arising out of a deep-seated chair without the use of the hands. The patient's stride length is good, but he has almost no arm swing bilaterally.   Labs: No labs sent for review   Total time spent on today's visit was 61 minutes, including face to face time only.  Time included that spent on review of records (prior notes available to me/labs/imaging if pertinent), discussing treatment and goals, answering patient's questions and coordinating care was done outside of this and was a separate 10 min.  Cc:  Wenda Low, MD

## 2021-09-25 ENCOUNTER — Other Ambulatory Visit: Payer: Self-pay

## 2021-09-25 ENCOUNTER — Ambulatory Visit: Payer: BC Managed Care – PPO | Admitting: Neurology

## 2021-09-25 ENCOUNTER — Encounter: Payer: Self-pay | Admitting: Neurology

## 2021-09-25 VITALS — BP 140/72 | HR 75 | Ht 70.5 in | Wt 180.0 lb

## 2021-09-25 DIAGNOSIS — G20 Parkinson's disease: Secondary | ICD-10-CM | POA: Diagnosis not present

## 2021-09-25 DIAGNOSIS — K117 Disturbances of salivary secretion: Secondary | ICD-10-CM | POA: Diagnosis not present

## 2021-10-06 NOTE — Progress Notes (Signed)
Assessment/Plan:   1.  Parkinsons Disease, diagnosed November, 2022  -Discussed AAN recommendations recommend in early levodopa in all patients.  -Community resources-discussed with the patient.  She has met with my LCSW.  -Discussed second opinion.  Would like to be referred for second opinion.  [facility]  [facility] would like to go to [facility].  We will send referral.  -Patient's wife asked multiple questions and answered those to the best of my ability today.  -Discussed referral to neuro rehab for therapies.  They we will hold on that until they get the second opinion.  2.  Sialorrhea  -Have discussed various options.  We have discussed that anticholinergic therapy is likely not an option for him because of increased fall risk and Parkinson's disease.  We have discussed that candy like lemon drop candy can be helpful if just using on a rare basis.  If it is more consistent (patient still works) and it is socially embarrassing and awkward at work, then the best treatment would be Myobloc.   Subjective:   MARRION Mcclain was seen today in follow up for Parkinsons disease.  My previous records were reviewed prior to todays visit as well as outside records available to me.  I just diagnosed the patient with Parkinson's disease less than 2 weeks ago.  The patient was incredibly overwhelmed just with the diagnosis and we decided to bring him back today to discuss further, when hopefully he could process the information more fully, and hopefully somebody could attend the appointment with him.  Patient's wife attends the visit today.  She asked multiple questions.  She does note that she has seen the patient slow down over the last year or so.  She has noticed drooling.  She has noticed that he is slow to answer questions.  She has noticed that he does not walk like he used to.  Current prescribed movement disorder medications: None   PREVIOUS MEDICATIONS: none  to date  ALLERGIES:  No Known Allergies  CURRENT MEDICATIONS:  Outpatient Encounter Medications as of 10/07/2021  Medication Sig   acetaminophen (TYLENOL) 325 MG tablet Take 650 mg by mouth every 6 (six) hours as needed.   ALPRAZolam (XANAX) 0.5 MG tablet Take 0.5 mg by mouth at bedtime as needed for anxiety.   b complex vitamins capsule Take 1 capsule by mouth daily.   busPIRone (BUSPAR) 5 MG tablet Take 5 mg by mouth 2 (two) times daily.   diclofenac (VOLTAREN) 50 MG EC tablet Take 50 mg by mouth 2 (two) times daily.   finasteride (PROSCAR) 5 MG tablet Take 5 mg by mouth daily.   fluticasone (FLONASE) 50 MCG/ACT nasal spray Place into both nostrils daily.   ibuprofen (ADVIL) 200 MG tablet Take 200 mg by mouth every 6 (six) hours as needed.   Krill Oil 500 MG CAPS Take by mouth.   loratadine (CLARITIN) 10 MG tablet Take 10 mg by mouth daily.   metFORMIN (GLUCOPHAGE) 850 MG tablet Take 850 mg by mouth 2 (two) times daily with a meal.   Multiple Vitamin (MULTIVITAMIN) tablet Take 1 tablet by mouth daily.   sildenafil (VIAGRA) 100 MG tablet Take 100 mg by mouth daily as needed for erectile dysfunction.   simvastatin (ZOCOR) 10 MG tablet SMARTSIG:1 Tablet(s) By Mouth Every Evening   vitamin C (ASCORBIC ACID) 500 MG tablet Take 500 mg by mouth daily.   No facility-administered encounter medications on file as of 10/07/2021.  Objective:   PHYSICAL EXAMINATION:    VITALS:   Vitals:   10/07/21 1136  BP: 126/82  Pulse: 78  SpO2: 98%  Weight: 176 lb (79.8 kg)  Height: _0  (1.778 m)    GEN:  The patient appears stated age and is in NAD. HEENT:  Normocephalic, atraumatic.  The mucous membranes are moist. The superficial temporal arteries are without ropiness or tenderness. CV:  RRR Lungs:  CTAB Neck/HEME:  There are no carotid bruits bilaterally.  Neurological examination:  Orientation: The patient is alert and oriented x3. Cranial nerves: There is good facial symmetry with  facial hypomimia. The speech is fluent and clear. Soft palate rises symmetrically and there is no tongue deviation. Hearing is intact to conversational tone. Sensation: Sensation is intact to light touch throughout Motor: Strength is at least antigravity x4.  Movement examination: Tone: There is mod increased tone in the L>RUE Abnormal movements: none even with distraction procedures Coordination:  There is  decremation with RAM's, with any form of RAMS, including alternating supination and pronation of the forearm, hand opening and closing, finger taps, heel taps and toe taps, L>R, UE>LE Gait and Station: The patient has minimal difficulty arising out of a deep-seated chair without the use of the hands. The patient's stride length is good, but he has almost no arm swing bilaterally.         Total time spent on today's visit was 44 minutes, including both face-to-face time and nonface-to-face time.  Time included that spent on review of records (prior notes available to me/labs/imaging if pertinent), discussing treatment and goals, answering patient's questions and coordinating care.  Cc:  Wenda Low, MD

## 2021-10-07 ENCOUNTER — Ambulatory Visit: Payer: BC Managed Care – PPO | Admitting: Licensed Clinical Social Worker

## 2021-10-07 ENCOUNTER — Other Ambulatory Visit: Payer: Self-pay

## 2021-10-07 ENCOUNTER — Encounter: Payer: Self-pay | Admitting: Neurology

## 2021-10-07 ENCOUNTER — Ambulatory Visit (INDEPENDENT_AMBULATORY_CARE_PROVIDER_SITE_OTHER): Payer: BC Managed Care – PPO | Admitting: Neurology

## 2021-10-07 VITALS — BP 126/82 | HR 78 | Ht 70.0 in | Wt 176.0 lb

## 2021-10-07 DIAGNOSIS — G20 Parkinson's disease: Secondary | ICD-10-CM | POA: Diagnosis not present

## 2021-10-07 DIAGNOSIS — F4321 Adjustment disorder with depressed mood: Secondary | ICD-10-CM | POA: Insufficient documentation

## 2021-10-07 NOTE — BH Specialist Note (Signed)
Integrated Behavioral Health Initial In-Person Visit  MRN: [id number] Name: Robert Mcclain  Number of Reading Clinician visits:: 1/6 Session Start time: 11:00  Session End time: 11:30 Total time: 30 minutes  Types of Service: [place] ([facility])  Interpretor:No. Interpretor Name and Language: NA   Warm Hand Off Completed. LCSW met with Pt prior and this was a follow up visit for education and and process of emotions following DX       Subjective: Robert Mcclain is a 60 y.o. male accompanied by Spouse Patient was referred by Dr. Wells Guiles Tat for Psychosocial Support . Patient reports the following symptoms/concerns: Pt report feelings of shock, sadness and some difficulty with adjustment  of the news.  Duration of problem: couple of weeks ; Severity of problem: moderate  Objective: Mood: Anxious and Depressed and Affect: Appropriate Risk of harm to self or others: No plan to harm self or others  Life Context: Family and Social: Pt reports some support from wife but they do not live together and do not have any children. School/Work: Pt reports working long hours and drives a lot  Self-Care: Pt reports able to manage all self care tasks Life Changes: receives news of new Parkinson's DX and has other medical conditions.   Patient and/or Family's Strengths/Protective Factors: Social and Emotional competence and Sense of purpose  Goals Addressed: Patient will: Reduce symptoms of: anxiety and depression Increase knowledge and/or ability of: coping skills and self-management skills  Demonstrate ability to: Increase healthy adjustment to current life circumstances  Progress towards Goals: Ongoing  Interventions: Interventions utilized: Motivational Interviewing, Solution-Focused Strategies, and Supportive Counseling  Standardized Assessments completed: Not Needed  Patient and/or Family Response: LCSW provided Supportive counseling  and validation of feelings .  In addition, discussion of taking active role in health care including exercise, increase support , education and counseling.  LCSW provided explanation of the negative impact on health the non compliance can have whether perspective , medication to exercise.  Pt verbalized understanding and open to meeting with LCSW and open to further education and exercise.      Patient Centered Plan: Patient is on the following Treatment Plan(s):  To follow up with MD and discuss if their is a need for second opinion.  Discussion other medical question and schedule appointment with LCSW if desired .    Assessment: Patient currently experiencing eelings of shock, sadness and some difficulty with adjustment  of the news of recent Dx of Parkinson's .   Patient may benefit from From further education and counseling services and support including resources in the community .Marland Kitchen  Plan: Follow up with behavioral health clinician on : As needed  Behavioral recommendations: Follow up with Counseling or support from support group and or exercise community program  Referral(s): Community Resources:  Exercise programs and Support Groups  "From scale of 1-10, how likely are you to follow plan?": 8  Paidyn Mcferran A Taylor-Paladino, LCSW

## 2021-10-07 NOTE — Patient Instructions (Signed)
Online Resources for Power over Parkinson's Group November 2022  Local [place] Online Groups  Power over Parkinson's Group :   Power Over Parkinson's Patient Education Group will be Wednesday, November 9th-*Hybrid meting*- in person at [place] location and via WEBEX at 2:00 pm.   Upcoming Power over Parkinson's Meetings:  2nd Wednesdays of the month at 2 pm:  November 9th, December 14th Contact Amy Marriott at [email or web address] if interested in participating in this online group Parkinson's Care Partners Group:    3rd Mondays, Contact Misty Paladino Atypical Parkinsonian Patient Group:   4th Wednesdays, Contact Misty Paladino If you are interested in participating in these online groups with Misty, please contact her directly for how to join those meetings.  Her contact information is [email or web address].   Parkinson Foundation:  www.parkinson.org PD Health at Home continues:  Mindfulness Mondays, Expert Briefing Tuesdays, Wellness Wednesdays, Take Time Thursdays, Fitness Fridays -Listings for June 2022 are on the website Upcoming Webinar:  Expert Briefing:  Let's Talk about Dementia.  Wednesday, November 2nd  at 1 pm. Register for expert briefings (webinars) at https://www.[email or web address].org/resources-support/online-education/expert-briefings-webinars  Please check out their website to sign up for emails and see their full online offerings  Michael J Fox Foundation:  www.michaeljfox.org  Upcoming Webinar:   2022 in Review:  Progress Toward Better Treatment and Prevention.  Thursday, November 17th at 12 noon Check out additional information on their website to see their full online offerings  Davis Phinney Foundation:  www.davisphinneyfoundation.org Upcoming Webinar:  NOH (Neurogenic Orthostatic Hypotension):  What it is, How to Know if you Have it, and How to Manage it.  Tuesday, November 15th at 3 pm.  Webinar Series:  Living with Parkinson's Meetup.    Third Thursdays of each month at 3 pm; next one is November 17th Care Partner Monthly Meetup.  With Connie Carpenter Phinney.  First Tuesday of each month, 2 pm Joy Breaks:  First Wednesday of each month, 2-3 pm. There will be art, doodling, making, crafting, listening, laughing, stories, and everything in between. No art experience necessary. No supplies required. Just show up for joy!  Register on their website. Check out additional information to Live Well Today on their website  Parkinson and Movement Disorders (PMD) Alliance:  www.pmdalliance.org NeuroLife Online:  Online Education Events Sign up for emails, which are sent weekly to give you updates on programming and online offerings  Parkinson's Association of [place]:  www.parkinsonassociation.org Information on online support groups, education events, and online exercises including Yoga, Parkinson's exercises and more-LOTS of information on links to PD resources and online events Virtual Support Group through Parkinson's Association of [place]; next one is scheduled for Wednesday, November 2nd  at 2 pm.  (These are typically scheduled for the 1st Wednesday of the month at 2 pm).  Visit website for details.  Additional links for movement activities: Parkinson's DRUMMING Classes/Music Therapy with Jane Maydian:  This is a returning class and it's FREE!  2nd Mondays, continuing November 14th.  Contact *Misty Taylor-Paladino at [email or web address] or Jane Maydian at [phone] or [email or web address]  PWR! Moves Classes at [facility].  Wednesdays 10 and 11 am.  Contact Amy Marriott, PT [email or web address] if interested. Here is a link to the PWR!Moves classes on Zoom from [place] Parkinson's Foundation - Daily Mon-Sat at 10:00. Via Zoom, FREE and open to all.  There is also a link below via Facebook if you use that  platform. https://www.parkinsonsmi.org/mpf-programs/exercise-and-movement-activities https://www.facebook.com/ParkinsonsMI.org/posts/pwr-moves-exercise-class-parkinson-wellness-recovery-online-with-angee-ludwa-pt-/10156827878021813/ Parkinson's Wellness Recovery (PWR! Moves)  www.pwr4life.org Info on   the PWR! Virtual Experience:  You will have access to our expertise through self-assessment, guided plans that start with the PD-specific fundamentals, educational content, tips, Q&A with an expert, and a growing library of PD-specific pre-recorded and live exercise classes of varying types and intensity - both physical and cognitive! If that is not enough, we offer 1:1 wellness consultations (in-person or virtual) to personalize your PWR! Virtual Experience.  Parkinson Foundation Fitness Fridays:  As part of the PD Health @ Home program, this free video series focuses each week on one aspect of fitness designed to support people living with Parkinson's.  These weekly videos highlight the Parkinson Foundation recent fitness guidelines for people with Parkinson's disease.  www.parkinson.org/understanding-parkinsons/coronavirus/PD-health-at-home/Fitness-Fridays Dance for PD website is offering free, live-stream classes throughout the week, as well as links to digital library of classes:  https://danceforparkinsons.org/ Dance for Parkinson's Class:  Dance Project [employer] [place].  Free offering for people with Parkinson's and care partners; virtual class.  For more information, contact [phone] or email Magalli Morana at [email or web address] Virtual dance and Pilates for Parkinson's classes: Click on the Community Tab> Parkinson's Movement Initiative Tab.  To register for classes and for more information, visit www.americandancefestival.org and click the "community" tab.  [employer] Parkinson's Cycling Classes  [employer]: 1pm on Fridays-Live classes at [employer] (Contact Margaret Hazen at  [email or web address] or [phone]) [employer]: Virtual Classes Mondays and Thursdays /Live classes Tuesday, Wednesday and Thursday (contact Marlee at [email or web address]  or [phone]) [place] [employer] Varied levels of classes are offered Mondays, Tuesdays and Thursdays at [facility].  To observe a class or for more information, call [phone] or email [email or web address] [facility] [employer]: Online Caregiver Education Opportunities:  www.well-springsolutions.org/caregiver-education/caregiver-support-group.  You may also contact Jodi Kolada at [email or web address] or [phone].   *Multiple opportunities below, as November is Caregiver Month!* A Guide to Physical and Mental Fitness for Family Caregivers.  Wednesday, November 9th, 12:30-2 pm at [employer] Taking Care of You!  Tuesday, November 15th, 11:30-12:45.  [facility], [address] Understanding Care Options and Advanced Care Planning.  Monday, November 21st, 4:30-5:45.  [facility] [address] [place] Contact Jodi Kolada above to register. [facility] Navigator:  Just1Navigator program, a free service to help individuals and families through the journey of determining care for older adults.  The "Navigator" is a social worker, Nicole Reynolds, who will speak with a prospective client and/or loved ones to provide an assessment of the situation and a set of recommendations for a personalized care plan -- all free of charge, and whether [facility] [employer] offers the needed service or not. If the need is not a service we provide, we are well-connected with reputable programs in town that we can refer you to.  www.well-springsolutions.org or to speak with the Navigator, call [phone].  

## 2021-10-23 DIAGNOSIS — R634 Abnormal weight loss: Secondary | ICD-10-CM | POA: Diagnosis not present

## 2021-10-23 DIAGNOSIS — R29810 Facial weakness: Secondary | ICD-10-CM | POA: Diagnosis not present

## 2021-10-23 DIAGNOSIS — L0232 Furuncle of buttock: Secondary | ICD-10-CM | POA: Diagnosis not present

## 2021-11-13 DIAGNOSIS — Z6825 Body mass index (BMI) 25.0-25.9, adult: Secondary | ICD-10-CM | POA: Diagnosis not present

## 2021-11-13 DIAGNOSIS — G20 Parkinson's disease: Secondary | ICD-10-CM | POA: Diagnosis not present

## 2021-12-26 DIAGNOSIS — E782 Mixed hyperlipidemia: Secondary | ICD-10-CM | POA: Diagnosis not present

## 2021-12-26 DIAGNOSIS — E1169 Type 2 diabetes mellitus with other specified complication: Secondary | ICD-10-CM | POA: Diagnosis not present

## 2021-12-26 DIAGNOSIS — K219 Gastro-esophageal reflux disease without esophagitis: Secondary | ICD-10-CM | POA: Diagnosis not present

## 2021-12-26 DIAGNOSIS — F419 Anxiety disorder, unspecified: Secondary | ICD-10-CM | POA: Diagnosis not present

## 2022-03-24 DIAGNOSIS — M545 Low back pain, unspecified: Secondary | ICD-10-CM | POA: Diagnosis not present

## 2022-05-14 DIAGNOSIS — G20 Parkinson's disease: Secondary | ICD-10-CM | POA: Diagnosis not present

## 2022-06-01 ENCOUNTER — Ambulatory Visit: Payer: BC Managed Care – PPO | Attending: Neurology

## 2022-06-01 DIAGNOSIS — R471 Dysarthria and anarthria: Secondary | ICD-10-CM | POA: Diagnosis not present

## 2022-06-01 NOTE — Patient Instructions (Signed)
SPEAK OUT! is a structured program targeting voice in patient's with Parkinson's. This program was developed by The Parkinson Voice Project. It is evidence based and based on principles of motor learning. The nonprofit provides free materials to patients being treated by a Speak Out! certified SLP.    www.parkinsonvoiceproject.org for more information   Learn about Parkinson's webinar: https://parkinsonvoiceproject.org/education-training/monthly-webinar/ -- highly recommend watching this webinar!!   Order your stimulus booklet: [phone] Your provider: Rekha Hobbins Johnson SLP, [facility]  This book is free!! They do offer a "pay if forward" option where you can donate to the non-profit organization so they can continue serving the Parkinson's population and providing these valuable resources to patients.  

## 2022-06-01 NOTE — Therapy (Unsigned)
OUTPATIENT SPEECH LANGUAGE PATHOLOGY PARKINSON'S EVALUATION   Patient Name: Robert Mcclain MRN: [id number] DOB:Sep 17, 1961, 61 y.o., male Today's Date: 06/02/2022  PCP: Georgann Housekeeper, MD REFERRING PROVIDER: Brion Aliment, FNP    End of Session - 06/02/22 0832     Visit Number 1    Number of Visits 17    Date for SLP Re-Evaluation 07/31/22    Authorization Type BCBS    Authorization Time Period 30 visit limit    SLP Start Time 1615    SLP Stop Time  1705    SLP Time Calculation (min) 50 min    Activity Tolerance Patient tolerated treatment well             Past Medical History:  Diagnosis Date   Diabetes [facility])    Past Surgical History:  Procedure Laterality Date   COLONOSCOPY     LASIK Bilateral    Patient Active Problem List   Diagnosis Date Noted   Adjustment disorder with depressed mood 10/07/2021   Parkinson's disease (HCC) 09/25/2021   Sialorrhea 09/25/2021    ONSET DATE: Parkinsons Disease dx November 2022 (05/27/2022=referral date)  REFERRING DIAG: G20 (ICD-10-CM) - Parkinson's disease   THERAPY DIAG: Dysarthria and anarthria  Rationale for Evaluation and Treatment Rehabilitation  SUBJECTIVE:   SUBJECTIVE STATEMENT: "Sometimes I mumble and my volume has gotten lower" Pt accompanied by: significant other (wife - Debbie)  PERTINENT HISTORY: Pt recently diagnosed with Parkinson's Disease in November 2022.   PAIN:  Are you having pain? No  FALLS: Has patient fallen in last 6 months?  No  LIVING ENVIRONMENT: Lives with: lives with their spouse Lives in: House/apartment  PLOF:  Level of assistance: Independent with ADLs, Independent with IADLs Employment: Environmental education officer employment Web designer)  PATIENT GOALS to improve speech   OBJECTIVE:   COGNITION: Overall cognitive status: Within functional limits for tasks assessed  Areas of impairment: Memory (minimal changes reported at this time which wife believed may be age related; will  continue to monitor) Comments: Pt endorsed he occasionally forgets why he walked into a room (~3x/week) but often able to ID with extra time. No other overt memory or cognitive concerns reported at this time.   MOTOR SPEECH: Overall motor speech: impaired Level of impairment: Conversation Respiration: thoracic breathing and clavicular breathing Phonation: low vocal intensity Resonance: WFL Articulation: Impaired: conversation Intelligibility: Intelligibility reduced (fluctuates) Motor planning: Appears intact Motor speech errors: inconsistent Interfering components:  PD Effective technique: increased vocal intensity and over articulate  ORAL MOTOR EXAMINATION Overall status: WFL  OBJECTIVE VOICE ASSESSMENT: Sustained "ah" maximum phonation time: 18 seconds Sustained "ah" loudness average: upper 70s dB Oral reading (passage) loudness average: upper 60s dB Oral reading loudness range: 63-68 dB Conversational loudness average: mid 60s dB Conversational loudness range: 63-68 dB Voice quality: hoarse and low vocal intensity Stimulability trials: Given SLP modeling and occasional min cues, loudness average increased to low 70s dB (range of 68 to 73) at paragraph level.  Comments: Conversational volume averaged mid 60s dB, which improved to upper 60s to low 70s dB with increased awareness and cues from SLP.   Completed audio recording of patients baseline voice without cueing from SLP: No  Pt does not report difficulty with swallowing which does not warrant further evaluation. Denied difficulty swallowing solids or liquids at this time. Occasional difficulty with larger medications and drooling endorsed, which has reportedly improved with initiation of PD meds per patient.   PATIENT REPORTED OUTCOME MEASURES (PROM): Communication Participation Item Bank: 22  ("  a little" difficulty reported on average across varied communication scenarios)  TODAY'S TREATMENT:  06-01-22: Education  initiated with patient and wife on rationale for ST services to address Parkinson's Disease related symptoms. Thoroughly discussed possible changes pt may be experiencing currently or in the future (dysarthria, dysphagia, and/or cognitive linguistic changes). Provided education on OP Neuro Parkinson's Program (therapy objectives, POCs, future screenings). Pt agreeable to initiation of ST services to address mild hypokinetic dysarthria.    PATIENT EDUCATION: Education details: see above Person educated: Patient and Spouse Education method: Explanation, Demonstration, and Handouts Education comprehension: verbalized understanding and returned demonstration   HOME EXERCISE PROGRAM: Speak Out! Program    GOALS: Goals reviewed with patient? Yes  SHORT TERM GOALS: Target date: 06/29/2022   Pt will complete Speak Out! HEP at least 1x/day given occasional min A over 2 sessions  Baseline: Goal status: INITIAL  2.  Pt will achieve targeted dB (85-90 dB) on warm up exercises with 80% accuracy given occasional min A over 2 sessions  Baseline:  Goal status: INITIAL  3.  Pt will achieve targeted dB (75-85 dB) on reading exercises with 80% accuracy given occasional min A over 2 sessions Baseline:  Goal status: INITIAL  4.  Pt will achieve targeted dB (72-78 dB) on cognitive exercises with 80% accuracy given occasional min A over 2 sessions Baseline:  Goal status: INITIAL  5.  Pt will utilize dysarthria compensations in 5-10 minute conversation and maintain WNL conversational volume (70-72 dB) given occasional min A over 2 sessions  Baseline:  Goal status: INITIAL   LONG TERM GOALS: Target date: 07/31/2022   Pt will complete Speak Out! HEP at least 1x/day (BID recommended) > 1 week  Baseline:  Goal status: INITIAL  2.  Pt will achieve targeted dB levels in demonstration of Speak Out! Lessons with 90% accuracy given rare min A over 2 sessions  Baseline:  Goal status: INITIAL  3.  Pt  will utilize dysarthria compensations in 15+ minute conversation and maintain WNL conversational volume (70-72 dB)  given rare min A over 2 sessions Baseline:  Goal status: INITIAL  4.  Pt will report improved communication effectiveness via PROM by 2 point improvement by last ST session  Baseline: CPIB=22 Goal status: INITIAL   ASSESSMENT:  CLINICAL IMPRESSION: Patient is a 61 y.o. male who was seen today for Parkinson's Disease. Pt has not received prior therapy services for PD as pt recently diagnosed in November 2022. Pt and wife have noted mild decline in conversational volume and occasionally reduced speech clarity resulting in mumble. Pt is now being asked to repeat himself more frequently, particularly on the phone. Today, conversational volume averaged mid 60s dB, which improved to upper 60s to low 70s dB with SLP education and instruction. Some muscle tension reported in neck ("feels like wearing a turtleneck"), which pt believes may be stress related. SLP will continue to monitor impact on speech and voice quality. Pt denied any overt changes in swallowing (subjectively improved with PD meds) or overt cognition concerns at this time. Pt would benefit from skilled ST intervention to optimize communication effectiveness and mitigate decline in communication skills secondary to PD.     OBJECTIVE IMPAIRMENTS  Objective impairments include dysarthria. These impairments are limiting patient from effectively communicating at home and in community. Factors affecting potential to achieve goals and functional outcome are medical prognosis. Patient will benefit from skilled SLP services to address above impairments and improve overall function.  REHAB POTENTIAL: Excellent  PLAN:  SLP FREQUENCY: 2x/week  SLP DURATION: 8 weeks  PLANNED INTERVENTIONS: Cueing hierachy, Cognitive reorganization, Internal/external aids, Functional tasks, Multimodal communication approach, SLP instruction and  feedback, Compensatory strategies, and Patient/family education    Gracy Racer, CCC-SLP 06/02/2022, 8:33 AM

## 2022-06-03 ENCOUNTER — Ambulatory Visit: Payer: BC Managed Care – PPO

## 2022-06-03 DIAGNOSIS — R471 Dysarthria and anarthria: Secondary | ICD-10-CM | POA: Diagnosis not present

## 2022-06-03 NOTE — Therapy (Signed)
OUTPATIENT SPEECH LANGUAGE PATHOLOGY TREATMENT NOTE   Patient Name: Robert Mcclain MRN: [id number] DOB:Mar 01, 1961, 61 y.o., male Today's Date: 06/03/2022  PCP: Georgann Housekeeper, MD REFERRING PROVIDER: Brion Aliment, FNP   END OF SESSION:   End of Session - 06/03/22 1612     Visit Number 2    Number of Visits 17    Date for SLP Re-Evaluation 07/31/22    Authorization Type BCBS    Authorization Time Period 30 visit limit    SLP Start Time 1615    SLP Stop Time  1700    SLP Time Calculation (min) 45 min    Activity Tolerance Patient tolerated treatment well             Past Medical History:  Diagnosis Date   Diabetes [facility])    Past Surgical History:  Procedure Laterality Date   COLONOSCOPY     LASIK Bilateral    Patient Active Problem List   Diagnosis Date Noted   Adjustment disorder with depressed mood 10/07/2021   Parkinson's disease (HCC) 09/25/2021   Sialorrhea 09/25/2021    ONSET DATE: Parkinsons Disease dx November 2022 (05/27/2022=referral date)  REFERRING DIAG: G20 (ICD-10-CM) - Parkinson's disease   THERAPY DIAG:  Dysarthria and anarthria  Rationale for Evaluation and Treatment Rehabilitation  SUBJECTIVE: "I've been busy"   PAIN:  Are you having pain? No  OBJECTIVE:   TODAY'S TREATMENT:  06-03-22: Initiated education and training of Speak Out! Principles and dysarthria compensations to address hypokinetic dysarthria. Targeted improving vocal quality and increasing intensity through progressively difficulty speech tasks using Speak Out! program, lesson 1. ST leads pt through exercises providing usual model prior to pt execution. Occasional min -A required to achieve target dB and slow rate of speech this date. Averages this date: loud "ah" 91 dB; reading 78 dB; cognitive speech task 74 dB. Conversational sample of approx 5 minutes, pt averages 71 dB with rare min-A.   06-01-22: Education initiated with patient and wife on rationale for ST  services to address Parkinson's Disease related symptoms. Thoroughly discussed possible changes pt may be experiencing currently or in the future (dysarthria, dysphagia, and/or cognitive linguistic changes). Provided education on OP Neuro Parkinson's Program (therapy objectives, POCs, future screenings). Pt agreeable to initiation of ST services to address mild hypokinetic dysarthria.      PATIENT EDUCATION: Education details: see above Person educated: Patient and Spouse Education method: Explanation, Demonstration, and Handouts Education comprehension: verbalized understanding and returned demonstration     HOME EXERCISE PROGRAM: Speak Out! Program      GOALS: Goals reviewed with patient? Yes   SHORT TERM GOALS: Target date: 06/29/2022    Pt will complete Speak Out! HEP at least 1x/day given occasional min A over 2 sessions  Baseline: Goal status: ongoing   2.  Pt will achieve targeted dB (85-90 dB) on warm up exercises with 80% accuracy given occasional min A over 2 sessions  Baseline:  Goal status: ongoing   3.  Pt will achieve targeted dB (75-85 dB) on reading exercises with 80% accuracy given occasional min A over 2 sessions Baseline:  Goal status: ongoing   4.  Pt will achieve targeted dB (72-78 dB) on cognitive exercises with 80% accuracy given occasional min A over 2 sessions Baseline:  Goal status: ongoing   5.  Pt will utilize dysarthria compensations in 5-10 minute conversation and maintain WNL conversational volume (70-72 dB) given occasional min A over 2 sessions  Baseline:  Goal status: ongoing  LONG TERM GOALS: Target date: 07/31/2022    Pt will complete Speak Out! HEP at least 1x/day (BID recommended) > 1 week  Baseline:  Goal status: ongoing   2.  Pt will achieve targeted dB levels in demonstration of Speak Out! Lessons with 90% accuracy given rare min A over 2 sessions  Baseline:  Goal status: ongoing   3.  Pt will utilize dysarthria compensations  in 15+ minute conversation and maintain WNL conversational volume (70-72 dB)  given rare min A over 2 sessions Baseline:  Goal status: ongoing   4.  Pt will report improved communication effectiveness via PROM by 2 point improvement by last ST session  Baseline: CPIB=22 Goal status: ongoing     ASSESSMENT:   CLINICAL IMPRESSION: Patient is a 62 y.o. male who was seen today for Parkinson's Disease. Targeted education and training of Speak Out! Program to optimize patient vocal intensity and clarity. Pt able to demonstrate exercises with occasional min A and carryover into short structured conversation over 5 minutes with rare min A. Pt would benefit from skilled ST intervention to optimize communication effectiveness and mitigate decline in communication skills secondary to PD.      OBJECTIVE IMPAIRMENTS  Objective impairments include dysarthria. These impairments are limiting patient from effectively communicating at home and in community. Factors affecting potential to achieve goals and functional outcome are medical prognosis. Patient will benefit from skilled SLP services to address above impairments and improve overall function.   REHAB POTENTIAL: Excellent   PLAN: SLP FREQUENCY: 2x/week   SLP DURATION: 8 weeks   PLANNED INTERVENTIONS: Cueing hierachy, Cognitive reorganization, Internal/external aids, Functional tasks, Multimodal communication approach, SLP instruction and feedback, Compensatory strategies, and Patient/family education     Gracy Racer, CCC-SLP 06/03/2022, 4:16 PM

## 2022-06-03 NOTE — Patient Instructions (Signed)
Practice these every exercises every day  Warm Up Exercise:  May -- Me -- My -- Drue Stager -- Moo Repeat 5 times   Ah Exercise: Say "ah" with good quality voice for a maximum of 10 seconds.  Repeat 5 times  Glide Exercise: Using "ah" glide up the scale. STOP Then, glide down the scale. STOP Repeat five times.

## 2022-06-08 ENCOUNTER — Ambulatory Visit: Payer: BC Managed Care – PPO

## 2022-06-08 DIAGNOSIS — R471 Dysarthria and anarthria: Secondary | ICD-10-CM

## 2022-06-08 NOTE — Therapy (Signed)
OUTPATIENT SPEECH LANGUAGE PATHOLOGY TREATMENT NOTE   Patient Name: Robert Mcclain MRN: [id number] DOB:Sep 19, 1961, 61 y.o., male Today's Date: 06/08/2022  PCP: Georgann Housekeeper, MD REFERRING PROVIDER: Brion Aliment, FNP   END OF SESSION:   End of Session - 06/08/22 1618     Visit Number 3    Number of Visits 17    Date for SLP Re-Evaluation 07/31/22    Authorization Type BCBS    Authorization Time Period 30 visit limit    SLP Start Time 1618    SLP Stop Time  1700    SLP Time Calculation (min) 42 min    Activity Tolerance Patient tolerated treatment well              Past Medical History:  Diagnosis Date   Diabetes [facility])    Past Surgical History:  Procedure Laterality Date   COLONOSCOPY     LASIK Bilateral    Patient Active Problem List   Diagnosis Date Noted   Adjustment disorder with depressed mood 10/07/2021   Parkinson's disease (HCC) 09/25/2021   Sialorrhea 09/25/2021    ONSET DATE: Parkinsons Disease dx November 2022 (05/27/2022=referral date)  REFERRING DIAG: G20 (ICD-10-CM) - Parkinson's disease   THERAPY DIAG: Dysarthria and anarthria  Rationale for Evaluation and Treatment Rehabilitation  SUBJECTIVE: "Doing okay"  PAIN:  Are you having pain? No  OBJECTIVE:   TODAY'S TREATMENT:  06-08-22: Pt entered with low 70s dB fading to mid 60s dB in opening conversation. Targeted improving vocal quality and increasing intensity through progressively difficulty speech tasks using Speak Out! program, lesson 2. ST leads pt through exercises providing usual model prior to pt execution. Occasional min -A required to achieve target dB and slow rate of speech this date. Averages this date: loud "ah" 88 dB; reading 76 dB; cognitive speech task 75 dB. Conversational sample of approx 4 minutes, pt averages 70 dB with occasional min-A to recognize and address volume decay.   06-03-22: Initiated education and training of Speak Out! Principles and dysarthria  compensations to address hypokinetic dysarthria. Targeted improving vocal quality and increasing intensity through progressively difficulty speech tasks using Speak Out! program, lesson 1. ST leads pt through exercises providing usual model prior to pt execution. Occasional min -A required to achieve target dB and slow rate of speech this date. Averages this date: loud "ah" 91 dB; reading 78 dB; cognitive speech task 74 dB. Conversational sample of approx 5 minutes, pt averages 71 dB with rare min-A.   06-01-22: Education initiated with patient and wife on rationale for ST services to address Parkinson's Disease related symptoms. Thoroughly discussed possible changes pt may be experiencing currently or in the future (dysarthria, dysphagia, and/or cognitive linguistic changes). Provided education on OP Neuro Parkinson's Program (therapy objectives, POCs, future screenings). Pt agreeable to initiation of ST services to address mild hypokinetic dysarthria.      PATIENT EDUCATION: Education details: see above Person educated: Patient and Spouse Education method: Explanation, Demonstration, and Handouts Education comprehension: verbalized understanding and returned demonstration     HOME EXERCISE PROGRAM: Speak Out! Program      GOALS: Goals reviewed with patient? Yes   SHORT TERM GOALS: Target date: 06/29/2022    Pt will complete Speak Out! HEP at least 1x/day given occasional min A over 2 sessions  Baseline: Goal status: ongoing   2.  Pt will achieve targeted dB (85-90 dB) on warm up exercises with 80% accuracy given occasional min A over 2 sessions  Baseline: 06-08-22  Goal status: ongoing   3.  Pt will achieve targeted dB (75-85 dB) on reading exercises with 80% accuracy given occasional min A over 2 sessions Baseline: 06-08-22 Goal status: ongoing   4.  Pt will achieve targeted dB (72-78 dB) on cognitive exercises with 80% accuracy given occasional min A over 2 sessions Baseline:  06-08-22 Goal status: ongoing   5.  Pt will utilize dysarthria compensations in 5-10 minute conversation and maintain WNL conversational volume (70-72 dB) given occasional min A over 2 sessions  Baseline:  Goal status: ongoing     LONG TERM GOALS: Target date: 07/31/2022    Pt will complete Speak Out! HEP at least 1x/day (BID recommended) > 1 week  Baseline:  Goal status: ongoing   2.  Pt will achieve targeted dB levels in demonstration of Speak Out! Lessons with 90% accuracy given rare min A over 2 sessions  Baseline:  Goal status: ongoing   3.  Pt will utilize dysarthria compensations in 15+ minute conversation and maintain WNL conversational volume (70-72 dB)  given rare min A over 2 sessions Baseline:  Goal status: ongoing   4.  Pt will report improved communication effectiveness via PROM by 2 point improvement by last ST session  Baseline: CPIB=22 Goal status: ongoing     ASSESSMENT:   CLINICAL IMPRESSION: Patient is a 61 y.o. male who was seen today for Parkinson's Disease. Targeted education and training of Speak Out! Program to optimize patient vocal intensity and clarity. Pt able to demonstrate targeted exercises with occasional min A and carryover into short structured conversation ~5 minutes with occasional min A. Pt would benefit from skilled ST intervention to optimize communication effectiveness and mitigate decline in communication skills secondary to PD.      OBJECTIVE IMPAIRMENTS  Objective impairments include dysarthria. These impairments are limiting patient from effectively communicating at home and in community. Factors affecting potential to achieve goals and functional outcome are medical prognosis. Patient will benefit from skilled SLP services to address above impairments and improve overall function.   REHAB POTENTIAL: Excellent   PLAN: SLP FREQUENCY: 2x/week   SLP DURATION: 8 weeks   PLANNED INTERVENTIONS: Cueing hierachy, Cognitive reorganization,  Internal/external aids, Functional tasks, Multimodal communication approach, SLP instruction and feedback, Compensatory strategies, and Patient/family education     Gracy Racer, CCC-SLP 06/08/2022, 4:20 PM

## 2022-06-10 NOTE — Therapy (Unsigned)
OUTPATIENT SPEECH LANGUAGE PATHOLOGY TREATMENT NOTE   Patient Name: Robert Mcclain MRN: [id number] DOB:07-21-1961, 61 y.o., male Today's Date: 06/11/2022  PCP: Georgann Housekeeper, MD REFERRING PROVIDER: Brion Aliment, FNP   END OF SESSION:   End of Session - 06/11/22 1538     Visit Number 4    Number of Visits 17    Date for SLP Re-Evaluation 07/31/22    Authorization Type BCBS    Authorization Time Period 30 visit limit    SLP Start Time 1539    SLP Stop Time  1615    SLP Time Calculation (min) 36 min    Activity Tolerance Patient tolerated treatment well               Past Medical History:  Diagnosis Date   Diabetes (HCC)    Past Surgical History:  Procedure Laterality Date   COLONOSCOPY     LASIK Bilateral    Patient Active Problem List   Diagnosis Date Noted   Adjustment disorder with depressed mood 10/07/2021   Parkinson's disease (HCC) 09/25/2021   Sialorrhea 09/25/2021    ONSET DATE: Parkinsons Disease dx November 2022 (05/27/2022=referral date)  REFERRING DIAG: G20 (ICD-10-CM) - Parkinson's disease   THERAPY DIAG: Dysarthria and anarthria  Rationale for Evaluation and Treatment Rehabilitation  SUBJECTIVE: "no one is asking me to repeat myself"  PAIN:  Are you having pain? No  OBJECTIVE:   TODAY'S TREATMENT:  06-10-22: Pt with conversational volume averaging 71 dB during 15 minute conversation. Volume noted to fluctuate to low of 65 dB, yet pt returning to normal conversational level with mod-I. Tells ST he has been "working hard" at using intent and overall has been successful. SLP led pt through Speak Out! lesson 3 with usual modeling of all targeted exercises prior to pt execution. Occasional min-A for proper execution of warm up exercises and using breath to power voice in counting. Usual min-A for stop-think-speak to facilitate intent during cognitive exercise. Averages this date: loud "ah" 92 dB; reading 81 dB; cognitive speech task 71  dB.  06-08-22: Pt entered with low 70s dB fading to mid 60s dB in opening conversation. Targeted improving vocal quality and increasing intensity through progressively difficulty speech tasks using Speak Out! program, lesson 2. ST leads pt through exercises providing usual model prior to pt execution. Occasional min -A required to achieve target dB and slow rate of speech this date. Averages this date: loud "ah" 88 dB; reading 76 dB; cognitive speech task 75 dB. Conversational sample of approx 4 minutes, pt averages 70 dB with occasional min-A to recognize and address volume decay.   06-03-22: Initiated education and training of Speak Out! Principles and dysarthria compensations to address hypokinetic dysarthria. Targeted improving vocal quality and increasing intensity through progressively difficulty speech tasks using Speak Out! program, lesson 1. ST leads pt through exercises providing usual model prior to pt execution. Occasional min -A required to achieve target dB and slow rate of speech this date. Averages this date: loud "ah" 91 dB; reading 78 dB; cognitive speech task 74 dB. Conversational sample of approx 5 minutes, pt averages 71 dB with rare min-A.   06-01-22: Education initiated with patient and wife on rationale for ST services to address Parkinson's Disease related symptoms. Thoroughly discussed possible changes pt may be experiencing currently or in the future (dysarthria, dysphagia, and/or cognitive linguistic changes). Provided education on OP Neuro Parkinson's Program (therapy objectives, POCs, future screenings). Pt agreeable to initiation of ST services to  address mild hypokinetic dysarthria.      PATIENT EDUCATION: Education details: see above Person educated: Patient and Spouse Education method: Explanation, Demonstration, and Handouts Education comprehension: verbalized understanding and returned demonstration     HOME EXERCISE PROGRAM: Speak Out! Program      GOALS: Goals  reviewed with patient? Yes   SHORT TERM GOALS: Target date: 06/29/2022    Pt will complete Speak Out! HEP at least 1x/day given occasional min A over 2 sessions  Baseline: Goal status: ongoing   2.  Pt will achieve targeted dB (85-90 dB) on warm up exercises with 80% accuracy given occasional min A over 2 sessions  Baseline: 06-08-22 Goal status: ongoing   3.  Pt will achieve targeted dB (75-85 dB) on reading exercises with 80% accuracy given occasional min A over 2 sessions Baseline: 06-08-22 Goal status: ongoing   4.  Pt will achieve targeted dB (72-78 dB) on cognitive exercises with 80% accuracy given occasional min A over 2 sessions Baseline: 06-08-22 Goal status: ongoing   5.  Pt will utilize dysarthria compensations in 5-10 minute conversation and maintain WNL conversational volume (70-72 dB) given occasional min A over 2 sessions  Baseline:  Goal status: ongoing     LONG TERM GOALS: Target date: 07/31/2022    Pt will complete Speak Out! HEP at least 1x/day (BID recommended) > 1 week  Baseline:  Goal status: ongoing   2.  Pt will achieve targeted dB levels in demonstration of Speak Out! Lessons with 90% accuracy given rare min A over 2 sessions  Baseline:  Goal status: ongoing   3.  Pt will utilize dysarthria compensations in 15+ minute conversation and maintain WNL conversational volume (70-72 dB)  given rare min A over 2 sessions Baseline:  Goal status: ongoing   4.  Pt will report improved communication effectiveness via PROM by 2 point improvement by last ST session  Baseline: CPIB=22 Goal status: ongoing     ASSESSMENT:   CLINICAL IMPRESSION: Patient is a 61 y.o. male who was seen today for Parkinson's Disease. Targeted education and training of Speak Out! Program to optimize patient vocal intensity and clarity. Pt able to demonstrate targeted exercises with occasional min A and carryover into short structured conversation ~5 minutes with occasional min A. Pt  would benefit from skilled ST intervention to optimize communication effectiveness and mitigate decline in communication skills secondary to PD.      OBJECTIVE IMPAIRMENTS  Objective impairments include dysarthria. These impairments are limiting patient from effectively communicating at home and in community. Factors affecting potential to achieve goals and functional outcome are medical prognosis. Patient will benefit from skilled SLP services to address above impairments and improve overall function.   REHAB POTENTIAL: Excellent   PLAN: SLP FREQUENCY: 2x/week   SLP DURATION: 8 weeks   PLANNED INTERVENTIONS: Cueing hierachy, Cognitive reorganization, Internal/external aids, Functional tasks, Multimodal communication approach, SLP instruction and feedback, Compensatory strategies, and Patient/family education     GAELAN GLENNON, CCC-SLP 06/11/2022, 3:43 PM

## 2022-06-11 ENCOUNTER — Ambulatory Visit: Payer: BC Managed Care – PPO | Admitting: Speech Pathology

## 2022-06-11 DIAGNOSIS — R471 Dysarthria and anarthria: Secondary | ICD-10-CM

## 2022-06-15 ENCOUNTER — Ambulatory Visit: Payer: BC Managed Care – PPO

## 2022-06-15 DIAGNOSIS — R471 Dysarthria and anarthria: Secondary | ICD-10-CM | POA: Diagnosis not present

## 2022-06-15 NOTE — Patient Instructions (Addendum)
Consider putting a reminder on your laptop to remind you to "Speak Out" during meetings   Be intentional with your volume and your rate   Make sure you continue to practice Speak Out! Program at least 1x/day  If you can't practice, make sure you do something to help set your intent (I.e., read an email aloud, do warm up exercises)

## 2022-06-15 NOTE — Therapy (Unsigned)
OUTPATIENT SPEECH LANGUAGE PATHOLOGY TREATMENT NOTE   Patient Name: Robert Mcclain MRN: [id number] DOB:[birth year], 61 y.o., male Today's Date: 06/15/2022  PCP: Georgann Housekeeper, MD REFERRING PROVIDER: Brion Aliment, FNP   END OF SESSION:   End of Session - 06/15/22 1616     Visit Number 5    Number of Visits 17    Date for SLP Re-Evaluation 07/31/22    Authorization Type BCBS    Authorization Time Period 30 visit limit    SLP Start Time 1615    SLP Stop Time  1700    SLP Time Calculation (min) 45 min    Activity Tolerance Patient tolerated treatment well               Past Medical History:  Diagnosis Date   Diabetes [facility])    Past Surgical History:  Procedure Laterality Date   COLONOSCOPY     LASIK Bilateral    Patient Active Problem List   Diagnosis Date Noted   Adjustment disorder with depressed mood 10/07/2021   Parkinson's disease (HCC) 09/25/2021   Sialorrhea 09/25/2021    ONSET DATE: Parkinsons Disease dx November 2022 (05/27/2022=referral date)  REFERRING DIAG: G20 (ICD-10-CM) - Parkinson's disease   THERAPY DIAG: Dysarthria and anarthria  Rationale for Evaluation and Treatment Rehabilitation  SUBJECTIVE: "no one is asking me to repeat myself"  PAIN:  Are you having pain? No  OBJECTIVE:   TODAY'S TREATMENT:  06-15-22: Discussed   06-10-22: Pt with conversational volume averaging 71 dB during 15 minute conversation. Volume noted to fluctuate to low of 65 dB, yet pt returning to normal conversational level with mod-I. Tells ST he has been "working hard" at using intent and overall has been successful. SLP led pt through Speak Out! lesson 3 with usual modeling of all targeted exercises prior to pt execution. Occasional min-A for proper execution of warm up exercises and using breath to power voice in counting. Usual min-A for stop-think-speak to facilitate intent during cognitive exercise. Averages this date: loud "ah" 92 dB; reading 81 dB;  cognitive speech task 71 dB.  06-08-22: Pt entered with low 70s dB fading to mid 60s dB in opening conversation. Targeted improving vocal quality and increasing intensity through progressively difficulty speech tasks using Speak Out! program, lesson 2. ST leads pt through exercises providing usual model prior to pt execution. Occasional min -A required to achieve target dB and slow rate of speech this date. Averages this date: loud "ah" 88 dB; reading 76 dB; cognitive speech task 75 dB. Conversational sample of approx 4 minutes, pt averages 70 dB with occasional min-A to recognize and address volume decay.   06-03-22: Initiated education and training of Speak Out! Principles and dysarthria compensations to address hypokinetic dysarthria. Targeted improving vocal quality and increasing intensity through progressively difficulty speech tasks using Speak Out! program, lesson 1. ST leads pt through exercises providing usual model prior to pt execution. Occasional min -A required to achieve target dB and slow rate of speech this date. Averages this date: loud "ah" 91 dB; reading 78 dB; cognitive speech task 74 dB. Conversational sample of approx 5 minutes, pt averages 71 dB with rare min-A.   06-01-22: Education initiated with patient and wife on rationale for ST services to address Parkinson's Disease related symptoms. Thoroughly discussed possible changes pt may be experiencing currently or in the future (dysarthria, dysphagia, and/or cognitive linguistic changes). Provided education on OP Neuro Parkinson's Program (therapy objectives, POCs, future screenings). Pt agreeable to initiation  of ST services to address mild hypokinetic dysarthria.      PATIENT EDUCATION: Education details: see above Person educated: Patient and Spouse Education method: Explanation, Demonstration, and Handouts Education comprehension: verbalized understanding and returned demonstration     HOME EXERCISE PROGRAM: Speak Out!  Program      GOALS: Goals reviewed with patient? Yes   SHORT TERM GOALS: Target date: 06/29/2022    Pt will complete Speak Out! HEP at least 1x/day given occasional min A over 2 sessions  Baseline: Goal status: ongoing   2.  Pt will achieve targeted dB (85-90 dB) on warm up exercises with 80% accuracy given occasional min A over 2 sessions  Baseline: 06-08-22 Goal status: ongoing   3.  Pt will achieve targeted dB (75-85 dB) on reading exercises with 80% accuracy given occasional min A over 2 sessions Baseline: 06-08-22 Goal status: ongoing   4.  Pt will achieve targeted dB (72-78 dB) on cognitive exercises with 80% accuracy given occasional min A over 2 sessions Baseline: 06-08-22 Goal status: ongoing   5.  Pt will utilize dysarthria compensations in 5-10 minute conversation and maintain WNL conversational volume (70-72 dB) given occasional min A over 2 sessions  Baseline:  Goal status: ongoing     LONG TERM GOALS: Target date: 07/31/2022    Pt will complete Speak Out! HEP at least 1x/day (BID recommended) > 1 week  Baseline:  Goal status: ongoing   2.  Pt will achieve targeted dB levels in demonstration of Speak Out! Lessons with 90% accuracy given rare min A over 2 sessions  Baseline:  Goal status: ongoing   3.  Pt will utilize dysarthria compensations in 15+ minute conversation and maintain WNL conversational volume (70-72 dB)  given rare min A over 2 sessions Baseline:  Goal status: ongoing   4.  Pt will report improved communication effectiveness via PROM by 2 point improvement by last ST session  Baseline: CPIB=22 Goal status: ongoing     ASSESSMENT:   CLINICAL IMPRESSION: Patient is a 61 y.o. male who was seen today for Parkinson's Disease. Targeted education and training of Speak Out! Program to optimize patient vocal intensity and clarity. Pt able to demonstrate targeted exercises with occasional min A and carryover into short structured conversation ~5 minutes  with occasional min A. Pt would benefit from skilled ST intervention to optimize communication effectiveness and mitigate decline in communication skills secondary to PD.      OBJECTIVE IMPAIRMENTS  Objective impairments include dysarthria. These impairments are limiting patient from effectively communicating at home and in community. Factors affecting potential to achieve goals and functional outcome are medical prognosis. Patient will benefit from skilled SLP services to address above impairments and improve overall function.   REHAB POTENTIAL: Excellent   PLAN: SLP FREQUENCY: 2x/week   SLP DURATION: 8 weeks   PLANNED INTERVENTIONS: Cueing hierachy, Cognitive reorganization, Internal/external aids, Functional tasks, Multimodal communication approach, SLP instruction and feedback, Compensatory strategies, and Patient/family education     Gracy Racer, CCC-SLP 06/15/2022, 4:18 PM

## 2022-06-18 ENCOUNTER — Ambulatory Visit: Payer: BC Managed Care – PPO

## 2022-06-18 NOTE — Therapy (Deleted)
OUTPATIENT SPEECH LANGUAGE PATHOLOGY TREATMENT NOTE   Patient Name: Robert Mcclain MRN: [id number] DOB:1960/11/21, 61 y.o., male Today's Date: 06/18/2022  PCP: Georgann Housekeeper, MD REFERRING PROVIDER: Brion Aliment, FNP   END OF SESSION:       Past Medical History:  Diagnosis Date   Diabetes [facility])    Past Surgical History:  Procedure Laterality Date   COLONOSCOPY     LASIK Bilateral    Patient Active Problem List   Diagnosis Date Noted   Adjustment disorder with depressed mood 10/07/2021   Parkinson's disease (HCC) 09/25/2021   Sialorrhea 09/25/2021    ONSET DATE: Parkinsons Disease dx November 2022 (05/27/2022=referral date)  REFERRING DIAG: G20 (ICD-10-CM) - Parkinson's disease   THERAPY DIAG: No diagnosis found.  Rationale for Evaluation and Treatment Rehabilitation  SUBJECTIVE: "***  PAIN:  Are you having pain? No  OBJECTIVE:   TODAY'S TREATMENT:  06-18-22:   06-15-22: Limited completion of Speak Out! HEP reported with some intermittent trials of warm up exercises conducted. SLP recommended continuing program at least 1x/day to optimize carryover and establish intentional speaking prior to speaking engagements. SLP led pt through Speak Out! lesson 4 with occasional modeling of targeted exercises prior to pt execution. Occasional min-A for proper execution of warm up exercises and using breath to power voice and slow rate while reading. Averages this date: loud "ah" 88 dB; reading 71 dB; cognitive speech task 71 dB. Subsequent conversation averaged ~69 dB in quiet environment with occasional cues required to aid patient recognition and correction of volume decay.   06-10-22: Pt with conversational volume averaging 71 dB during 15 minute conversation. Volume noted to fluctuate to low of 65 dB, yet pt returning to normal conversational level with mod-I. Tells ST he has been "working hard" at using intent and overall has been successful. SLP led pt through Speak  Out! lesson 3 with usual modeling of all targeted exercises prior to pt execution. Occasional min-A for proper execution of warm up exercises and using breath to power voice in counting. Usual min-A for stop-think-speak to facilitate intent during cognitive exercise. Averages this date: loud "ah" 92 dB; reading 81 dB; cognitive speech task 71 dB.  06-08-22: Pt entered with low 70s dB fading to mid 60s dB in opening conversation. Targeted improving vocal quality and increasing intensity through progressively difficulty speech tasks using Speak Out! program, lesson 2. ST leads pt through exercises providing usual model prior to pt execution. Occasional min -A required to achieve target dB and slow rate of speech this date. Averages this date: loud "ah" 88 dB; reading 76 dB; cognitive speech task 75 dB. Conversational sample of approx 4 minutes, pt averages 70 dB with occasional min-A to recognize and address volume decay.   06-03-22: Initiated education and training of Speak Out! Principles and dysarthria compensations to address hypokinetic dysarthria. Targeted improving vocal quality and increasing intensity through progressively difficulty speech tasks using Speak Out! program, lesson 1. ST leads pt through exercises providing usual model prior to pt execution. Occasional min -A required to achieve target dB and slow rate of speech this date. Averages this date: loud "ah" 91 dB; reading 78 dB; cognitive speech task 74 dB. Conversational sample of approx 5 minutes, pt averages 71 dB with rare min-A.   06-01-22: Education initiated with patient and wife on rationale for ST services to address Parkinson's Disease related symptoms. Thoroughly discussed possible changes pt may be experiencing currently or in the future (dysarthria, dysphagia, and/or cognitive linguistic  changes). Provided education on OP Neuro Parkinson's Program (therapy objectives, POCs, future screenings). Pt agreeable to initiation of ST services  to address mild hypokinetic dysarthria.      PATIENT EDUCATION: Education details: see above Person educated: Patient and Spouse Education method: Explanation, Demonstration, and Handouts Education comprehension: verbalized understanding and returned demonstration     HOME EXERCISE PROGRAM: Speak Out! Program      GOALS: Goals reviewed with patient? Yes   SHORT TERM GOALS: Target date: 06/29/2022    Pt will complete Speak Out! HEP at least 1x/day given occasional min A over 2 sessions  Baseline: Goal status: ongoing   2.  Pt will achieve targeted dB (85-90 dB) on warm up exercises with 80% accuracy given occasional min A over 2 sessions  Baseline: 06-08-22 Goal status: ongoing   3.  Pt will achieve targeted dB (75-85 dB) on reading exercises with 80% accuracy given occasional min A over 2 sessions Baseline: 06-08-22 Goal status: ongoing   4.  Pt will achieve targeted dB (72-78 dB) on cognitive exercises with 80% accuracy given occasional min A over 2 sessions Baseline: 06-08-22 Goal status: ongoing   5.  Pt will utilize dysarthria compensations in 5-10 minute conversation and maintain WNL conversational volume (70-72 dB) given occasional min A over 2 sessions  Baseline:  Goal status: ongoing     LONG TERM GOALS: Target date: 07/31/2022    Pt will complete Speak Out! HEP at least 1x/day (BID recommended) > 1 week  Baseline:  Goal status: ongoing   2.  Pt will achieve targeted dB levels in demonstration of Speak Out! Lessons with 90% accuracy given rare min A over 2 sessions  Baseline:  Goal status: ongoing   3.  Pt will utilize dysarthria compensations in 15+ minute conversation and maintain WNL conversational volume (70-72 dB)  given rare min A over 2 sessions Baseline:  Goal status: ongoing   4.  Pt will report improved communication effectiveness via PROM by 2 point improvement by last ST session  Baseline: CPIB=22 Goal status: ongoing     ASSESSMENT:    CLINICAL IMPRESSION: Patient is a 61 y.o. male who was seen today for Parkinson's Disease. Targeted education and training of Speak Out! Program to optimize patient vocal intensity and clarity. Pt able to demonstrate targeted exercises with occasional min A and carryover into short structured conversation ~5 minutes with occasional min A. Pt would benefit from skilled ST intervention to optimize communication effectiveness and mitigate decline in communication skills secondary to PD.      OBJECTIVE IMPAIRMENTS  Objective impairments include dysarthria. These impairments are limiting patient from effectively communicating at home and in community. Factors affecting potential to achieve goals and functional outcome are medical prognosis. Patient will benefit from skilled SLP services to address above impairments and improve overall function.   REHAB POTENTIAL: Excellent   PLAN: SLP FREQUENCY: 2x/week   SLP DURATION: 8 weeks   PLANNED INTERVENTIONS: Cueing hierachy, Cognitive reorganization, Internal/external aids, Functional tasks, Multimodal communication approach, SLP instruction and feedback, Compensatory strategies, and Patient/family education     Gracy Racer, CCC-SLP 06/18/2022, 11:05 AM

## 2022-06-24 ENCOUNTER — Ambulatory Visit: Payer: BC Managed Care – PPO | Attending: Neurology

## 2022-06-24 DIAGNOSIS — R471 Dysarthria and anarthria: Secondary | ICD-10-CM | POA: Insufficient documentation

## 2022-06-24 NOTE — Therapy (Addendum)
OUTPATIENT SPEECH LANGUAGE PATHOLOGY TREATMENT NOTE   Patient Name: Robert Mcclain MRN: [id number] DOB:12-24-1960, 61 y.o., male Today's Date: 06/24/2022  PCP: Wenda Low, MD REFERRING PROVIDER: Danie Binder, FNP   END OF SESSION:   End of Session - 06/24/22 1617     Visit Number 6    Number of Visits 17    Date for SLP Re-Evaluation 07/31/22    Authorization Type [employer]    Authorization Time Period 30 visit limit    SLP Start Time 1617    SLP Stop Time  1700    SLP Time Calculation (min) 43 min    Activity Tolerance Patient tolerated treatment well                Past Medical History:  Diagnosis Date   Diabetes [facility])    Past Surgical History:  Procedure Laterality Date   COLONOSCOPY     LASIK Bilateral    Patient Active Problem List   Diagnosis Date Noted   Adjustment disorder with depressed mood 10/07/2021   Parkinson's disease ([place]) 09/25/2021   Sialorrhea 09/25/2021    ONSET DATE: Parkinsons Disease dx November 2022 (05/27/2022=referral date)  REFERRING DIAG: G20 (ICD-10-CM) - Parkinson's disease   THERAPY DIAG: Dysarthria and anarthria  Rationale for Evaluation and Treatment Rehabilitation  SUBJECTIVE: "I just got back from the trip"  PAIN:  Are you having pain? No  OBJECTIVE:   TODAY'S TREATMENT:  06-24-22: Pt entered and maintained WNL volume (70-72 dB) in 10 minute conversation with mod I. SLP led pt through Speak Out! lesson 5 with occasional modeling of targeted exercises prior to pt execution. Occasional min-A for modifying rate and using breath to power voice during structured tasks. Averages this date: loud "ah" 88 dB; reading 74 dB; cognitive speech task 73 dB. Subsequent conversation averaged 71 dB in quiet environment with good carryover of recommended techniques. Pt reports positive feedback from spouse and friends for improved volume and clarity of speech.   06-15-22: Limited completion of Speak Out! HEP reported with some  intermittent trials of warm up exercises conducted. SLP recommended continuing program at least 1x/day to optimize carryover and establish intentional speaking prior to speaking engagements. SLP led pt through Speak Out! lesson 4 with occasional modeling of targeted exercises prior to pt execution. Occasional min-A for proper execution of warm up exercises and using breath to power voice and slow rate while reading. Averages this date: loud "ah" 88 dB; reading 71 dB; cognitive speech task 71 dB. Subsequent conversation averaged ~69 dB in quiet environment with occasional cues required to aid patient recognition and correction of volume decay.   06-10-22: Pt with conversational volume averaging 71 dB during 15 minute conversation. Volume noted to fluctuate to low of 65 dB, yet pt returning to normal conversational level with mod-I. Tells ST he has been "working hard" at using intent and overall has been successful. SLP led pt through Speak Out! lesson 3 with usual modeling of all targeted exercises prior to pt execution. Occasional min-A for proper execution of warm up exercises and using breath to power voice in counting. Usual min-A for stop-think-speak to facilitate intent during cognitive exercise. Averages this date: loud "ah" 92 dB; reading 81 dB; cognitive speech task 71 dB.  06-08-22: Pt entered with low 70s dB fading to mid 60s dB in opening conversation. Targeted improving vocal quality and increasing intensity through progressively difficulty speech tasks using Speak Out! program, lesson 2. ST leads pt through exercises providing  usual model prior to pt execution. Occasional min -A required to achieve target dB and slow rate of speech this date. Averages this date: loud "ah" 88 dB; reading 76 dB; cognitive speech task 75 dB. Conversational sample of approx 4 minutes, pt averages 70 dB with occasional min-A to recognize and address volume decay.   06-03-22: Initiated education and training of Speak Out!  Principles and dysarthria compensations to address hypokinetic dysarthria. Targeted improving vocal quality and increasing intensity through progressively difficulty speech tasks using Speak Out! program, lesson 1. ST leads pt through exercises providing usual model prior to pt execution. Occasional min -A required to achieve target dB and slow rate of speech this date. Averages this date: loud "ah" 91 dB; reading 78 dB; cognitive speech task 74 dB. Conversational sample of approx 5 minutes, pt averages 71 dB with rare min-A.   06-01-22: Education initiated with patient and wife on rationale for ST services to address Parkinson's Disease related symptoms. Thoroughly discussed possible changes pt may be experiencing currently or in the future (dysarthria, dysphagia, and/or cognitive linguistic changes). Provided education on OP Neuro Parkinson's Program (therapy objectives, POCs, future screenings). Pt agreeable to initiation of ST services to address mild hypokinetic dysarthria.      PATIENT EDUCATION: Education details: see above Person educated: Patient and Spouse Education method: Explanation, Demonstration, and Handouts Education comprehension: verbalized understanding and returned demonstration     HOME EXERCISE PROGRAM: Speak Out! Program      GOALS: Goals reviewed with patient? Yes   SHORT TERM GOALS: Target date: 06/29/2022    Pt will complete Speak Out! HEP at least 1x/day given occasional min A over 2 sessions  Baseline:  Goal status: ongoing   2.  Pt will achieve targeted dB (85-90 dB) on warm up exercises with 80% accuracy given occasional min A over 2 sessions  Baseline: 06-08-22, 06-24-22 Goal status: Met   3.  Pt will achieve targeted dB (75-85 dB) on reading exercises with 80% accuracy given occasional min A over 2 sessions Baseline: 06-08-22, 06-24-22 Goal status: Met   4.  Pt will achieve targeted dB (72-78 dB) on cognitive exercises with 80% accuracy given occasional min A  over 2 sessions Baseline: 06-08-22, 06-24-22 Goal status: Met   5.  Pt will utilize dysarthria compensations in 5-10 minute conversation and maintain WNL conversational volume (70-72 dB) given occasional min A over 2 sessions  Baseline: 06-24-22 Goal status: ongoing     LONG TERM GOALS: Target date: 07/31/2022    Pt will complete Speak Out! HEP at least 1x/day (BID recommended) > 1 week  Baseline:  Goal status: ongoing   2.  Pt will achieve targeted dB levels in demonstration of Speak Out! Lessons with 90% accuracy given rare min A over 2 sessions  Baseline: 06-24-22 Goal status: ongoing   3.  Pt will utilize dysarthria compensations in 15+ minute conversation and maintain WNL conversational volume (70-72 dB) given rare min A over 2 sessions  Baseline: 06-24-22 Goal status: ongoing   4.  Pt will report improved communication effectiveness via PROM by 2 point improvement by last ST session  Baseline: CPIB=22 Goal status: ongoing     ASSESSMENT:   CLINICAL IMPRESSION: Patient is a 61 y.o. male who was seen today for Parkinson's Disease. Targeted education and training of Speak Out! Program to optimize patient vocal intensity and clarity. Pt able to demonstrate targeted exercises with occasional min A and carryover targeted strategies into 15-20 minute conversation with mod  I. Pt would benefit from skilled ST intervention to optimize communication effectiveness and mitigate decline in communication skills secondary to PD.      OBJECTIVE IMPAIRMENTS  Objective impairments include dysarthria. These impairments are limiting patient from effectively communicating at home and in community. Factors affecting potential to achieve goals and functional outcome are medical prognosis. Patient will benefit from skilled SLP services to address above impairments and improve overall function.   REHAB POTENTIAL: Excellent   PLAN: SLP FREQUENCY: 2x/week   SLP DURATION: 8 weeks   PLANNED INTERVENTIONS:  Cueing hierachy, Cognitive reorganization, Internal/external aids, Functional tasks, Multimodal communication approach, SLP instruction and feedback, Compensatory strategies, and Patient/family education     Marzetta Board, CCC-SLP 06/24/2022, 4:18 PM

## 2022-06-25 DIAGNOSIS — Z6825 Body mass index (BMI) 25.0-25.9, adult: Secondary | ICD-10-CM | POA: Diagnosis not present

## 2022-06-25 DIAGNOSIS — G20 Parkinson's disease: Secondary | ICD-10-CM | POA: Diagnosis not present

## 2022-06-26 ENCOUNTER — Ambulatory Visit: Payer: BC Managed Care – PPO

## 2022-06-26 ENCOUNTER — Ambulatory Visit: Payer: BC Managed Care – PPO | Admitting: Speech Pathology

## 2022-06-26 DIAGNOSIS — R471 Dysarthria and anarthria: Secondary | ICD-10-CM

## 2022-06-26 NOTE — Therapy (Signed)
OUTPATIENT SPEECH LANGUAGE PATHOLOGY TREATMENT NOTE (DISCHARGE)   Patient Name: Robert Mcclain MRN: [id number] DOB:1961-04-19, 61 y.o., male Today's Date: 06/26/2022  PCP: Wenda Low, MD REFERRING PROVIDER: Danie Binder, FNP   END OF SESSION:   End of Session - 06/26/22 1313     Visit Number 7    Number of Visits 17    Date for SLP Re-Evaluation 07/31/22    Authorization Type BCBS    Authorization Time Period 30 visit limit    SLP Start Time 1315    SLP Stop Time  3779    SLP Time Calculation (min) 32 min    Activity Tolerance Patient tolerated treatment well               SPEECH THERAPY DISCHARGE SUMMARY  Visits from Start of Care: 7  Current functional level related to goals / functional outcomes: Robert Mcclain presents with improved awareness of dysarthria and demonstrates ability to achieve and maintain targeted conversational volume (70-72 dB) with use of targeted strategies given rare min A. Pt feels confident with ability to carryover strategies and HEP to optimize vocal intensity and clarity s/p ST discharge this date.    Remaining deficits: PD   Education / Equipment: Speak Out! Program, dysarthria compensations, intent for PD, caregiver education    Patient agrees to discharge. Patient goals were partially met. Patient is being discharged due to being pleased with the current functional level.     Past Medical History:  Diagnosis Date   Diabetes [facility])    Past Surgical History:  Procedure Laterality Date   COLONOSCOPY     LASIK Bilateral    Patient Active Problem List   Diagnosis Date Noted   Adjustment disorder with depressed mood 10/07/2021   Parkinson's disease ([place]) 09/25/2021   Sialorrhea 09/25/2021    ONSET DATE: Parkinsons Disease dx November 2022 (05/27/2022=referral date)  REFERRING DIAG: G20 (ICD-10-CM) - Parkinson's disease   THERAPY DIAG: Dysarthria and anarthria  Rationale for Evaluation and Treatment  Rehabilitation  SUBJECTIVE: "I'm doing good"   PAIN:  Are you having pain? No  OBJECTIVE:   TODAY'S TREATMENT:  06-26-22: Pt entered and maintained WNL volume (70-72 dB) in 30 minute conversation with mod I. Pt discussed neurology appointment yesterday, in which pt reported some occasional coughing at meals. Educated patient on using principle of intent to focus on slowing rate while eating/drinking and being intentional about not over-filling oral cavity, as this are reported occurences. Handout provided with safe swallow recommendations, with anticipated benefit. Pt was not overly concerned with swallowing at this time requiring additional assessment or ST tx, but SLP recommended to request ST referral/MBSS if swallowing declines or coughing increases. Educated recommended maintenance program to optimize carryover of learned techniques and use of intentional speech for optimal vocal intensity and clarity. Re-administerd CPIB PROM, with score of 28 (6 point improvement from eval) indicating subjective improvement in communication. Pt verbalized understanding and agreement with all SLP recommendations and denied additional questions at this time. Pt is pleased with current progress and agreeable to ST discharge this date.   06-24-22: Pt entered and maintained WNL volume (70-72 dB) in 10 minute conversation with mod I. SLP led pt through Speak Out! lesson 5 with occasional modeling of targeted exercises prior to pt execution. Occasional min-A for modifying rate and using breath to power voice during structured tasks. Averages this date: loud "ah" 88 dB; reading 74 dB; cognitive speech task 73 dB. Subsequent conversation averaged 71 dB in quiet  environment with good carryover of recommended techniques. Pt reports positive feedback from spouse and friends for improved volume and clarity of speech.   06-15-22: Limited completion of Speak Out! HEP reported with some intermittent trials of warm up exercises  conducted. SLP recommended continuing program at least 1x/day to optimize carryover and establish intentional speaking prior to speaking engagements. SLP led pt through Speak Out! lesson 4 with occasional modeling of targeted exercises prior to pt execution. Occasional min-A for proper execution of warm up exercises and using breath to power voice and slow rate while reading. Averages this date: loud "ah" 88 dB; reading 71 dB; cognitive speech task 71 dB. Subsequent conversation averaged ~69 dB in quiet environment with occasional cues required to aid patient recognition and correction of volume decay.   06-10-22: Pt with conversational volume averaging 71 dB during 15 minute conversation. Volume noted to fluctuate to low of 65 dB, yet pt returning to normal conversational level with mod-I. Tells ST he has been "working hard" at using intent and overall has been successful. SLP led pt through Speak Out! lesson 3 with usual modeling of all targeted exercises prior to pt execution. Occasional min-A for proper execution of warm up exercises and using breath to power voice in counting. Usual min-A for stop-think-speak to facilitate intent during cognitive exercise. Averages this date: loud "ah" 92 dB; reading 81 dB; cognitive speech task 71 dB.  06-08-22: Pt entered with low 70s dB fading to mid 60s dB in opening conversation. Targeted improving vocal quality and increasing intensity through progressively difficulty speech tasks using Speak Out! program, lesson 2. ST leads pt through exercises providing usual model prior to pt execution. Occasional min -A required to achieve target dB and slow rate of speech this date. Averages this date: loud "ah" 88 dB; reading 76 dB; cognitive speech task 75 dB. Conversational sample of approx 4 minutes, pt averages 70 dB with occasional min-A to recognize and address volume decay.   06-03-22: Initiated education and training of Speak Out! Principles and dysarthria compensations  to address hypokinetic dysarthria. Targeted improving vocal quality and increasing intensity through progressively difficulty speech tasks using Speak Out! program, lesson 1. ST leads pt through exercises providing usual model prior to pt execution. Occasional min -A required to achieve target dB and slow rate of speech this date. Averages this date: loud "ah" 91 dB; reading 78 dB; cognitive speech task 74 dB. Conversational sample of approx 5 minutes, pt averages 71 dB with rare min-A.   06-01-22: Education initiated with patient and wife on rationale for ST services to address Parkinson's Disease related symptoms. Thoroughly discussed possible changes pt may be experiencing currently or in the future (dysarthria, dysphagia, and/or cognitive linguistic changes). Provided education on OP Neuro Parkinson's Program (therapy objectives, POCs, future screenings). Pt agreeable to initiation of ST services to address mild hypokinetic dysarthria.      PATIENT EDUCATION: Education details: see above Person educated: Patient and Spouse Education method: Explanation, Demonstration, and Handouts Education comprehension: verbalized understanding and returned demonstration     HOME EXERCISE PROGRAM: Speak Out! Program      GOALS: Goals reviewed with patient? Yes   SHORT TERM GOALS: Target date: 06/29/2022    Pt will complete Speak Out! HEP at least 1x/day given occasional min A over 2 sessions  Baseline: 06-26-22 Goal status: Partially Met   2.  Pt will achieve targeted dB (85-90 dB) on warm up exercises with 80% accuracy given occasional min A over 2  sessions  Baseline: 06-08-22, 06-24-22 Goal status: Met   3.  Pt will achieve targeted dB (75-85 dB) on reading exercises with 80% accuracy given occasional min A over 2 sessions Baseline: 06-08-22, 06-24-22 Goal status: Met   4.  Pt will achieve targeted dB (72-78 dB) on cognitive exercises with 80% accuracy given occasional min A over 2 sessions Baseline:  06-08-22, 06-24-22 Goal status: Met   5.  Pt will utilize dysarthria compensations in 5-10 minute conversation and maintain WNL conversational volume (70-72 dB) given occasional min A over 2 sessions  Baseline: 06-24-22, 06-26-22 Goal status: Met     LONG TERM GOALS: Target date: 07/31/2022    Pt will complete Speak Out! HEP at least 1x/day (BID recommended) > 1 week  Baseline: 06-26-22 Goal status: Partially Met   2.  Pt will achieve targeted dB levels in demonstration of Speak Out! Lessons with 90% accuracy given rare min A over 2 sessions  Baseline: 06-24-22, 06-26-22 Goal status: Met   3.  Pt will utilize dysarthria compensations in 15+ minute conversation and maintain WNL conversational volume (70-72 dB) given rare min A over 2 sessions  Baseline: 06-24-22, 06-26-22 Goal status: Met   4.  Pt will report improved communication effectiveness via PROM by 2 point improvement by last ST session  Baseline: CPIB=22; 28 (d/c) Goal status: Met     ASSESSMENT:   CLINICAL IMPRESSION: Patient is a 61 y.o. male who was seen today for Parkinson's Disease. Completed education and training of Speak Out! Program to optimize patient vocal intensity and clarity. Pt able to demonstrate targeted exercises with mod I and carryover targeted strategies into 30 minute conversation with mod I. Pt able to demonstrate good carryover of trained techniques to optimize vocal intensity and clarity. Pt feels confident independently implementing trained techniques. 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    OBJECTIVE IMPAIRMENTS  Objective impairments include dysarthria. These impairments are limiting patient from effectively communicating at home and in community. Factors affecting potential to achieve goals and functional outcome are medical prognosis. Patient will benefit from skilled SLP services to address above  impairments and improve overall function.   REHAB POTENTIAL: Excellent   PLAN: SLP FREQUENCY: 2x/week   SLP DURATION: 8 weeks   PLANNED INTERVENTIONS: Cueing hierachy, Cognitive reorganization, Internal/external aids, Functional tasks, Multimodal communication approach, SLP instruction and feedback, Compensatory strategies, and Patient/family education     Marzetta Board, CCC-SLP 06/26/2022, 1:49 PM

## 2022-06-26 NOTE — Patient Instructions (Addendum)
Safe Swallow Strategies: Focus on slowing the rate while you're eating  Try to sit down and sit upright with all your meals  Try to limit how much you are putting into your mouth/onto your fork  If your coughing increases, please request for the doctor to send you back to speech therapy or request a swallow study   Signs of Aspiration Pneumonia   Chest pain/tightness Fever (can be low grade) Cough  With foul-smelling phlegm (sputum) With sputum containing pus or blood With greenish sputum Fatigue  Shortness of breath  Wheezing   **IF YOU HAVE THESE SIGNS, CONTACT YOUR DOCTOR OR GO TO THE EMERGENCY DEPARTMENT OR URGENT CARE AS SOON AS POSSIBLE**

## 2022-09-18 DIAGNOSIS — Z125 Encounter for screening for malignant neoplasm of prostate: Secondary | ICD-10-CM | POA: Diagnosis not present

## 2022-09-18 DIAGNOSIS — Z0000 Encounter for general adult medical examination without abnormal findings: Secondary | ICD-10-CM | POA: Diagnosis not present

## 2022-09-18 DIAGNOSIS — E1169 Type 2 diabetes mellitus with other specified complication: Secondary | ICD-10-CM | POA: Diagnosis not present

## 2022-09-18 DIAGNOSIS — E782 Mixed hyperlipidemia: Secondary | ICD-10-CM | POA: Diagnosis not present

## 2022-09-18 DIAGNOSIS — Z13220 Encounter for screening for lipoid disorders: Secondary | ICD-10-CM | POA: Diagnosis not present

## 2022-09-18 DIAGNOSIS — F419 Anxiety disorder, unspecified: Secondary | ICD-10-CM | POA: Diagnosis not present

## 2022-09-18 DIAGNOSIS — G20A1 Parkinson's disease without dyskinesia, without mention of fluctuations: Secondary | ICD-10-CM | POA: Diagnosis not present

## 2023-01-05 DIAGNOSIS — G20A1 Parkinson's disease without dyskinesia, without mention of fluctuations: Secondary | ICD-10-CM | POA: Diagnosis not present

## 2023-01-28 DIAGNOSIS — J302 Other seasonal allergic rhinitis: Secondary | ICD-10-CM | POA: Diagnosis not present

## 2023-01-28 DIAGNOSIS — J069 Acute upper respiratory infection, unspecified: Secondary | ICD-10-CM | POA: Diagnosis not present

## 2023-01-28 DIAGNOSIS — R051 Acute cough: Secondary | ICD-10-CM | POA: Diagnosis not present

## 2023-02-09 ENCOUNTER — Ambulatory Visit: Payer: BC Managed Care – PPO | Admitting: Occupational Therapy

## 2023-02-09 ENCOUNTER — Ambulatory Visit: Payer: BC Managed Care – PPO | Admitting: Physical Therapy

## 2023-02-09 ENCOUNTER — Ambulatory Visit: Payer: BC Managed Care – PPO | Admitting: Speech Pathology

## 2023-03-22 DIAGNOSIS — E1169 Type 2 diabetes mellitus with other specified complication: Secondary | ICD-10-CM | POA: Diagnosis not present

## 2023-03-22 DIAGNOSIS — F419 Anxiety disorder, unspecified: Secondary | ICD-10-CM | POA: Diagnosis not present

## 2023-03-22 DIAGNOSIS — K219 Gastro-esophageal reflux disease without esophagitis: Secondary | ICD-10-CM | POA: Diagnosis not present

## 2023-03-22 DIAGNOSIS — E785 Hyperlipidemia, unspecified: Secondary | ICD-10-CM | POA: Diagnosis not present

## 2023-04-20 DIAGNOSIS — L570 Actinic keratosis: Secondary | ICD-10-CM | POA: Diagnosis not present

## 2023-05-13 DIAGNOSIS — G20A1 Parkinson's disease without dyskinesia, without mention of fluctuations: Secondary | ICD-10-CM | POA: Diagnosis not present

## 2023-05-26 NOTE — Therapy (Unsigned)
Occupational Therapy Parkinson's Disease Screen  Hand dominance:  Right   Physical Performance Test item #2 (simulated eating):  12 sec  Physical Performance Test item #4 (donning/doffing jacket):  11 sec  9-hole peg test:    RUE  40 sec        LUE  42 sec   Change in ability to perform ADLs/IADLs:  none reported  Other Comments:  Tremor B and impulsive at times  Pt would benefit from occupational therapy evaluation due to impairments with coordination, tremor, and a lack of skilled OT intervention following PD diagnosis. Pt to benefit from OT for PD interventions and strategies as needed to improve overall functional status.   Will request referral for OT eval from neurology at [facility].

## 2023-05-27 ENCOUNTER — Encounter: Payer: Self-pay | Admitting: Physical Therapy

## 2023-05-27 ENCOUNTER — Ambulatory Visit: Payer: BC Managed Care – PPO | Attending: Internal Medicine | Admitting: Physical Therapy

## 2023-05-27 ENCOUNTER — Ambulatory Visit: Payer: BC Managed Care – PPO | Admitting: Occupational Therapy

## 2023-05-27 ENCOUNTER — Ambulatory Visit: Payer: BC Managed Care – PPO | Admitting: Speech Pathology

## 2023-05-27 DIAGNOSIS — R278 Other lack of coordination: Secondary | ICD-10-CM | POA: Insufficient documentation

## 2023-05-27 DIAGNOSIS — R471 Dysarthria and anarthria: Secondary | ICD-10-CM

## 2023-05-27 DIAGNOSIS — R29818 Other symptoms and signs involving the nervous system: Secondary | ICD-10-CM | POA: Insufficient documentation

## 2023-05-27 NOTE — Therapy (Signed)
[facility] [address] [place], [place], [address] Phone: [phone]   Fax:  [phone]  Patient Details  Name: Robert Mcclain MRN: [id number] Date of Birth: [birth year] Referring Provider:  Georgann Housekeeper, MD  Encounter Date: 05/27/2023  Speech Therapy Parkinson's Disease Screen   Decibel Level today: high 60s dB  (WNL=70-72 dB) with sound level meter 30cm away from pt's mouth. Pt's conversational volume has decreased slightly since last treatment course. However, patient states that he continues to practice his warm up with Speak Out! to warm up his voice every day. Has job is in Airline pilot, and he states that his voice only wavers in the afternoon. Reminder to speak with intent when voice gets tired at the end of the day. Education provided on engagement in practice to maintain current skill level.   Pt does not report difficulty with swallowing, which does not warrant further evaluation.   Pt does does not require speech therapy services at this time. Recommend ST screen in another six months.  [id number] Student-SLP 05/27/2023, 7:59 AM  [facility] [address] [place], [place], [address] Phone: [phone]   Fax:  [phone]

## 2023-05-27 NOTE — Therapy (Signed)
[facility] [address] [place], [place], [address] Phone: [phone]   Fax:  [phone]  Patient Details  Name: Robert Mcclain MRN: [id number] Date of Birth: 1961-04-04 Referring Provider:  Georgann Housekeeper, MD  Encounter Date: 05/27/2023  Physical Therapy Parkinson's Disease Screen   Timed Up and Go test:7.69 seconds   10 meter walk test:7.41 seconds = 4.26 ft/sec  5 time sit to stand test:10.5 seconds with no UE support  Was diagnosed with PD at end of 2022. Sees a neurologist at [facility]. Balance is doing good. No falls, just tripped and stumbled one time. Was going up steps and shoe caught the top step. Working out 3 times a week, going to [employer] Staying pretty active. Walks a mile on the treadmill and then does weights at the gym. Also has to walk a lot for his job.   Patient would benefit from Physical Therapy evaluation due to pt not having PD specific therapy since being diagnosed, decr LUE arm swing. Will request orders from [facility].   Explained rationale for PD specific PT    Drake Leach, PT, DPT 05/27/2023, 8:17 AM  [facility] [address] [place], [place], [address] Phone: [phone]   Fax:  [phone]

## 2023-05-31 ENCOUNTER — Telehealth: Payer: Self-pay | Admitting: Physical Therapy

## 2023-05-31 NOTE — Telephone Encounter (Signed)
Faxed as provider not in EPIC:   Dr. Seward Meth,  Michae Grimley was see for PD screens on 05/27/23. The patient would benefit for OT and PT referrals for Parkinson's disease due to impaired coordination/tremor and pt not yet receiving therapies for PD. Pt in agreement with this plan.   If you agree, please place an order in [facility] workque in [facility] or fax the order to [phone]. Thank you, Sherlie Ban, PT, DPT 05/31/23 11:01 AM    [facility] [address] [place], [place]  [address] Phone:  [phone] Fax:  [phone]

## 2023-07-14 ENCOUNTER — Encounter: Payer: Self-pay | Admitting: Occupational Therapy

## 2023-07-14 ENCOUNTER — Ambulatory Visit: Payer: BC Managed Care – PPO | Attending: Internal Medicine | Admitting: Occupational Therapy

## 2023-07-14 DIAGNOSIS — R471 Dysarthria and anarthria: Secondary | ICD-10-CM | POA: Diagnosis not present

## 2023-07-14 DIAGNOSIS — R29818 Other symptoms and signs involving the nervous system: Secondary | ICD-10-CM | POA: Insufficient documentation

## 2023-07-14 DIAGNOSIS — R278 Other lack of coordination: Secondary | ICD-10-CM | POA: Diagnosis not present

## 2023-07-14 NOTE — Therapy (Unsigned)
OUTPATIENT OCCUPATIONAL THERAPY PARKINSON'S EVALUATION  Patient Name: Robert Mcclain MRN: [id number] DOB:May 19, 1961, 62 y.o., male Today's Date: 07/14/2023  PCP: Georgann Housekeeper, MD  REFERRING PROVIDER: Raquel Sarna, MD  END OF SESSION:  OT End of Session - 07/14/23 1556     Visit Number 1    Number of Visits 7    Date for OT Re-Evaluation 09/10/23    Authorization Type BCBS    OT Start Time 1619    OT Stop Time 1657    OT Time Calculation (min) 38 min    Activity Tolerance Patient tolerated treatment well    Behavior During Therapy Impulsive;Flat affect            Past Medical History:  Diagnosis Date   Diabetes [facility])    Past Surgical History:  Procedure Laterality Date   COLONOSCOPY     LASIK Bilateral    Patient Active Problem List   Diagnosis Date Noted   Adjustment disorder with depressed mood 10/07/2021   Parkinson's disease 09/25/2021   Sialorrhea 09/25/2021    ONSET DATE: Parkinsons Disease dx November 2022   REFERRING DIAG: G20.A1 (ICD-10-CM) - Parkinson's disease without dyskinesia, without mention of fluctuations  THERAPY DIAG:  Other lack of coordination  Other symptoms and signs involving the nervous system  Dysarthria and anarthria  Rationale for Evaluation and Treatment: Rehabilitation  SUBJECTIVE:   SUBJECTIVE STATEMENT: Pt reports he doesn't think he needs PT.  Pt accompanied by: self  PERTINENT HISTORY: PMX: Adjustment disorder with depressed mood, DM, Lasik (OU), Cervical spondylosis with radiculopathy     Parkinsons Disease dx November 2022   PRECAUTIONS: Fall  WEIGHT BEARING RESTRICTIONS: No  PAIN:  Are you having pain? No  FALLS: Has patient fallen in last 6 months? 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   PATIENT GOALS: Manage PD symptoms  OBJECTIVE:   HAND DOMINANCE: Right  ADLs: Overall ADLs: mod I  UB Dressing: setup of buttons on shirt sleeves night before  IADLs:  Mostly mod I Handwriting: 90% legible  MOBILITY STATUS: Independent  POSTURE COMMENTS:  rounded shoulders and forward head  ACTIVITY TOLERANCE: Activity tolerance: good  FUNCTIONAL OUTCOME MEASURES: Fastening/unfastening 3 buttons: 18 seconds Physical performance test: PPT#2 (simulated eating) 16 seconds & PPT#4 (donning/doffing jacket): 12 seconds  seconds COORDINATION: 9 Hole Peg test: Right: 31 sec; Left: 42 sec Box and Blocks:  Right 40 blocks, Left 42blocks  UE ROM:   BUE - WNL  UE MMT:    BUE - WNL  SENSATION: Paresthesias in R index finger  MUSCLE TONE: BUE - WFL  COGNITION: Overall cognitive status: Within functional limits for tasks assessed  OBSERVATIONS: Dyskinesias   TODAY'S TREATMENT:  N/A for this visit  PATIENT EDUCATION: Education details: OT POC and Role Person educated: Patient Education method: Explanation Education comprehension: verbalized understanding  HOME EXERCISE PROGRAM: N/A for this visit  GOALS: SHORT TERM GOALS: Target date: 08/12/2023    Pt will be independent with PD specific HEP.  Baseline: not yet initiated Goal status: INITIAL  2.  Pt will verbalize understanding of adapted strategies to maximize safety and independence with ADLs/IADLs.  Baseline: not yet initiated Goal status: INITIAL  LONG TERM GOALS: Target date: 09/10/2023  Pt will verbalize understanding of ways to prevent future PD related complications and PD community resources.  Baseline: not yet initiated Goal status: INITIAL  2.  Pt will verbalize understanding of ways to keep thinking  skills sharp and ways to compensate for STM changes in the future.  Baseline: not yet initiated Goal status: INITIAL  3.  Patient will complete nine-hole peg with use of L in 34 seconds or less. Baseline: 42 seconds Goal status: INITIAL  ASSESSMENT:  CLINICAL IMPRESSION: Patient is a 62 y.o. male who was seen today for occupational therapy evaluation for PD. Hx includes Adjustment disorder with depressed mood, DM, Lasik (OU), and Cervical spondylosis with radiculopathy. Patient currently presents slightly below baseline level of functioning demonstrating functional deficits and impairments as noted below. Pt would benefit from skilled OT services in the outpatient setting to provide education with respect to management of PD symptoms as needed to improve/maintain ADL and IADL independence and safety.    PERFORMANCE DEFICITS: in functional skills including ADLs, IADLs, coordination, Fine motor control, Gross motor control, and UE functional use IMPAIRMENTS: are limiting patient from ADLs, IADLs, work, and leisure.   COMORBIDITIES:  may have co-morbidities  that affects occupational performance. Patient will benefit from skilled OT to address above impairments and improve overall function.  MODIFICATION OR ASSISTANCE TO COMPLETE EVALUATION: No modification of tasks or assist necessary to complete an evaluation.  OT OCCUPATIONAL PROFILE AND HISTORY: Problem focused assessment: Including review of records relating to presenting problem.  CLINICAL DECISION MAKING: LOW - limited treatment options, no task modification necessary  REHAB POTENTIAL: Good  EVALUATION COMPLEXITY: Low    PLAN:  OT FREQUENCY: 1x/week  OT DURATION: 6 weeks  PLANNED INTERVENTIONS: self care/ADL training, therapeutic exercise, therapeutic activity, neuromuscular re-education, functional mobility training, patient/family education, coping strategies training, DME and/or AE instructions, and  Re-evaluation  RECOMMENDED OTHER SERVICES: PT, ST (screen)  CONSULTED AND AGREED WITH PLAN OF CARE: Patient  PLAN FOR NEXT SESSION: Initiate ADL strategies; assess writing; PWR! Hands; coordination HEP   Delana Meyer, OT 07/14/2023, 5:18 PM

## 2023-08-02 ENCOUNTER — Encounter: Payer: Self-pay | Admitting: Internal Medicine

## 2023-08-02 ENCOUNTER — Other Ambulatory Visit: Payer: Self-pay | Admitting: Internal Medicine

## 2023-08-02 ENCOUNTER — Ambulatory Visit
Admission: RE | Admit: 2023-08-02 | Discharge: 2023-08-02 | Disposition: A | Discharge status: Home / Self Care | Payer: BC Managed Care – PPO | Source: Ambulatory Visit | Attending: Internal Medicine | Admitting: Internal Medicine

## 2023-08-02 DIAGNOSIS — M5430 Sciatica, unspecified side: Secondary | ICD-10-CM | POA: Diagnosis not present

## 2023-08-02 DIAGNOSIS — N529 Male erectile dysfunction, unspecified: Secondary | ICD-10-CM | POA: Diagnosis not present

## 2023-08-02 DIAGNOSIS — R058 Other specified cough: Secondary | ICD-10-CM

## 2023-08-02 DIAGNOSIS — R059 Cough, unspecified: Secondary | ICD-10-CM | POA: Diagnosis not present

## 2023-08-02 DIAGNOSIS — M5136 Other intervertebral disc degeneration, lumbar region: Secondary | ICD-10-CM | POA: Diagnosis not present

## 2023-08-03 ENCOUNTER — Ambulatory Visit: Payer: BC Managed Care – PPO | Attending: Internal Medicine | Admitting: Occupational Therapy

## 2023-08-03 DIAGNOSIS — R278 Other lack of coordination: Secondary | ICD-10-CM | POA: Insufficient documentation

## 2023-08-03 DIAGNOSIS — R471 Dysarthria and anarthria: Secondary | ICD-10-CM | POA: Diagnosis not present

## 2023-08-03 DIAGNOSIS — R29818 Other symptoms and signs involving the nervous system: Secondary | ICD-10-CM | POA: Diagnosis not present

## 2023-08-03 NOTE — Therapy (Signed)
OUTPATIENT OCCUPATIONAL THERAPY PARKINSON'S TREATMENT  Patient Name: Robert Mcclain MRN: [id number] DOB:19-Sep-1961, 62 y.o., male Today's Date: 08/03/2023  PCP: Georgann Housekeeper, MD  REFERRING PROVIDER: Raquel Sarna, MD  END OF SESSION:  OT End of Session - 08/03/23 0932     Visit Number 2    Number of Visits 7    Date for OT Re-Evaluation 09/10/23    Authorization Type BCBS    OT Start Time 0934    OT Stop Time 1014    OT Time Calculation (min) 40 min    Activity Tolerance Patient tolerated treatment well    Behavior During Therapy Impulsive;Flat affect            Past Medical History:  Diagnosis Date   Diabetes [facility])    Past Surgical History:  Procedure Laterality Date   COLONOSCOPY     LASIK Bilateral    Patient Active Problem List   Diagnosis Date Noted   Adjustment disorder with depressed mood 10/07/2021   Parkinson's disease 09/25/2021   Sialorrhea 09/25/2021    ONSET DATE: Parkinsons Disease dx November 2022   REFERRING DIAG: G20.A1 (ICD-10-CM) - Parkinson's disease without dyskinesia, without mention of fluctuations  THERAPY DIAG:  Other lack of coordination  Other symptoms and signs involving the nervous system  Dysarthria and anarthria  Rationale for Evaluation and Treatment: Rehabilitation  SUBJECTIVE:   SUBJECTIVE STATEMENT: Pt reports he usually uses pens with grips to them.   Pt accompanied by: self  PERTINENT HISTORY: PMX: Adjustment disorder with depressed mood, DM, Lasik (OU), Cervical spondylosis with radiculopathy     Parkinsons Disease dx November 2022   PRECAUTIONS: Fall  WEIGHT BEARING RESTRICTIONS: No  PAIN:  Are you having pain? No  FALLS: Has patient fallen in last 6 months? No  LIVING ENVIRONMENT: Lives with: lives with their spouse Lives in: House/apartment Stairs: Yes: Internal: 12 steps; bilateral but cannot reach both and External: 2-5 steps; none Has following equipment at home: None  PLOF: Independent;  driving; [employer] requiring driving and lots of typing.   PATIENT GOALS: Manage PD symptoms  OBJECTIVE:   HAND DOMINANCE: Right  ADLs: Overall ADLs: mod I  UB Dressing: setup of buttons on shirt sleeves night before  IADLs:  Mostly mod I Handwriting: 90% legible  MOBILITY STATUS: Independent  POSTURE COMMENTS:  rounded shoulders and forward head  ACTIVITY TOLERANCE: Activity tolerance: good  FUNCTIONAL OUTCOME MEASURES: Fastening/unfastening 3 buttons: 18 seconds Physical performance test: PPT#2 (simulated eating) 16 seconds & PPT#4 (donning/doffing jacket): 12 seconds  seconds COORDINATION: 9 Hole Peg test: Right: 31 sec; Left: 42 sec Box and Blocks:  Right 40 blocks, Left 42blocks  UE ROM:   BUE - WNL  UE MMT:    BUE - WNL  SENSATION: Paresthesias in R index finger  MUSCLE TONE: BUE - WFL  COGNITION: Overall cognitive status: Within functional limits for tasks assessed  OBSERVATIONS: Dyskinesias   TODAY'S TREATMENT:             - Self-care/home management completed for duration as noted below including:  OT initiated BIG ADL strategies as noted in pt instructions to improve independence and safety with occupational performance.   - Therapeutic activities completed for duration as noted below including: Pt also educated on writing strategies as noted in pt instructions to improve legibility of handwriting.   - Therapeutic exercises completed for duration as noted below including:  Initiated PWR! Hands as noted in pt instructions for BUE ROM  and coordination.   PATIENT EDUCATION: Education details: PWR! Hands; ADL strategies; writing strategies Person educated: Patient Education method: Explanation Education comprehension: verbalized understanding  HOME EXERCISE PROGRAM: 08/03/2023: PWR! Hands; ADL strategies; writing strategies  GOALS: SHORT TERM GOALS: Target date: 08/12/2023    Pt will be independent with PD specific  HEP.  Baseline: not yet initiated Goal status: INITIAL  2.  Pt will verbalize understanding of adapted strategies to maximize safety and independence with ADLs/IADLs.  Baseline: not yet initiated Goal status: INITIAL  LONG TERM GOALS: Target date: 09/10/2023  Pt will verbalize understanding of ways to prevent future PD related complications and PD community resources.  Baseline: not yet initiated Goal status: INITIAL  2.  Pt will verbalize understanding of ways to keep thinking skills sharp and ways to compensate for STM changes in the future.  Baseline: not yet initiated Goal status: INITIAL  3.  Patient will complete nine-hole peg with use of L in 34 seconds or less. Baseline: 42 seconds Goal status: INITIAL  ASSESSMENT:  CLINICAL IMPRESSION: Pt demonstrates good understanding of PD strategies as needed to progress towards goals. Recommend initiation of coordination and  PWR! Moves to establish comprehensive program for PD management.   PERFORMANCE DEFICITS: in functional skills including ADLs, IADLs, coordination, Fine motor control, Gross motor control, and UE functional use IMPAIRMENTS: are limiting patient from ADLs, IADLs, work, and leisure.   COMORBIDITIES:  may have co-morbidities  that affects occupational performance. Patient will benefit from skilled OT to address above impairments and improve overall function.  REHAB POTENTIAL: Good  PLAN:  OT FREQUENCY: 1x/week  OT DURATION: 6 weeks  PLANNED INTERVENTIONS: self care/ADL training, therapeutic exercise, therapeutic activity, neuromuscular re-education, functional mobility training, patient/family education, coping strategies training, DME and/or AE instructions, and Re-evaluation  RECOMMENDED OTHER SERVICES: PT, ST (screen)  CONSULTED AND AGREED WITH PLAN OF CARE: Patient  PLAN FOR NEXT SESSION: Review PWR! Hands; Initiate coordination HEP   Delana Meyer, OT 08/03/2023, 5:36 PM

## 2023-08-03 NOTE — Patient Instructions (Addendum)
Performing Daily Activities with Big Movements  Pick at least 2 activities a day and perform with BIG, DELIBERATE movements/effort.  Try different activities each day. This can make the activity easier and turn daily activities into exercise to prevent problems in the future!  If you are standing during the activity, make sure to keep feet apart and stand with good/big/posture.  Examples: Dressing  Pull-over shirt:  good posture, bring shirt to head (don't bring head down), deliberately push arms into sleeves Jacket:  stand with feet apart, twist when putting on jacket, deliberate push arms into sleeves Underwear/Pants:  Sit, lean forward, and push foot into pants deliberately Open hands to pull down shirt/put on socks/pull up pants--get more material in your hand  Buttoning - Open hands big before fastening each button, deliberate movement (angry buttons--push button through hole), unfasten by using pull-push method   Bathing - Wash/dry with long strokes  Brushing your teeth - Big, slow movements  Cutting food - Long deliberate cuts, put tip of knife down in front of food  Eating - Hold utensil in the middle (not the end), hold fork straight up and down to stab food  Picking up a cup/bottle - Open hand up big and get object all the way in palm  Opening jar/bottle - Move as much as you can with each turn, twist wrist  Putting on seatbelt - Feet apart, twist when reaching, look at where you are reaching  Hanging up clothes/getting clothes down from closet - Reach with big effort, open hand, straighten elbow  Putting away groceries/dishes - Reach with big effort  Wiping counter/table - Move in big, long strokes, open hand  Stirring while cooking - Exaggerate movement  Cleaning windows - Feet apart, move in big, long strokes  Sweeping/Raking- Feet apart and one foot in front of the other, move arms in big, long strokes  Vacuuming - Feet apart and one in front of the other, push  with big movement  Folding clothes - Exaggerate arm movements  Washing car - Move in big, long strokes, feet apart  Changing light bulb or Using a screwdriver - Move as much as you can with each turn, twist wrist  Walking into a store/restaurant - Walk with big steps, good posture, swing arms if able  Standing up from a chair/recliner/sofa - Scoot forward, lean forward, and stand with big effort  Picking up something from floor/reaching in low cabinet - Get close to object, Position feet apart and one in front of the other.   Suggestions for Handwriting Changes Many people with Parkinson's notice changes in their handwriting.  Handwriting often becomes small and cramped, and can become more difficult to control when writing for longer periods of time.  This handwriting change is called micrographia.  Why does micrographia occur?  Parkinson's can cause slowing of movement, and feelings of muscle stiffness in the hands and fingers.  Loss of automatic motion also affects the easy, flowing motion of handwriting.  This can impact even simple writing tasks such as signing your name or writing a shopping list.  Attempts to write quickly without thinking about forming each letter contributes to small, cramped handwriting, and may cause the hand to develop a feeling of tightness.  How can I make writing easier?  Make a deliberate effort to form each letter.  This can be hard to do at first, but is very effective in improving size and legibility of handwriting.  Use a pen grip (round or triangular  shaped rubber or foam cylinders available at stationary stores or where writing materials are found) or a larger size pen to keep your hand more relaxed.  Try printing rather than writing in a cursive style. Printing causes you to pause briefly between each letter, keeping writing more legible.   Using lined paper may provide a "visual target" to keep all letters big when writing.   A ballpoint pen  typically works better than felt tip or "rolling writer"/gel styles.  Rest your hand if it begins to feel "tight".  Pause briefly when you see your handwriting becoming smaller.  Avoid hurrying or trying to write long passages if you are feeling stressed or fatigued.  Practice helps!  Remind yourself to slow down, aim big, and pause often!  Perform "flicks"/PWR! Hands if your hand feels tight, your writing gets smaller, before you start writing, or if tremors increase.  Involving your team: An occupational therapist can provide assessment and individual recommendations for improvement of your handwriting.  This handout was adapted from [email or web address] [employer]   PWR! Hand Exercises Perform each exercise at least 10 repetitions 1x/day, and PWR! PUSH throughout the day when you are having trouble using your hands (picking up small objects, writing, eating, typing, buttoning, etc.). ** Make each movement big and deliberate; feel the movement.  PWR! UP: Fists to open fingers BIG  PWR! Rock:  Move wrists up and down [employer]! Twist: Twist palms up and down BIG  PWR! Step: Step your thumb to index finger while keeping other fingers straight. Flick fingers out BIG (thumb out/straighten fingers). Repeat with other fingers.  PWR! PUSH: Push hands out BIG. Elbows straight, wrists up, fingers open and spread apart BIG.

## 2023-08-09 DIAGNOSIS — M25551 Pain in right hip: Secondary | ICD-10-CM | POA: Diagnosis not present

## 2023-08-10 ENCOUNTER — Ambulatory Visit: Payer: BC Managed Care – PPO | Admitting: Occupational Therapy

## 2023-08-10 DIAGNOSIS — R278 Other lack of coordination: Secondary | ICD-10-CM | POA: Diagnosis not present

## 2023-08-10 DIAGNOSIS — R29818 Other symptoms and signs involving the nervous system: Secondary | ICD-10-CM | POA: Diagnosis not present

## 2023-08-10 DIAGNOSIS — R471 Dysarthria and anarthria: Secondary | ICD-10-CM | POA: Diagnosis not present

## 2023-08-10 NOTE — Therapy (Unsigned)
OUTPATIENT OCCUPATIONAL THERAPY PARKINSON'S TREATMENT  Patient Name: Robert Mcclain MRN: [id number] DOB:19-Jun-1961, 62 y.o., male Today's Date: 08/10/2023  PCP: Georgann Housekeeper, MD  REFERRING PROVIDER: Raquel Sarna, MD  END OF SESSION:  OT End of Session - 08/10/23 1015     Visit Number 3    Number of Visits 7    Date for OT Re-Evaluation 09/10/23    Authorization Type BCBS    OT Start Time 0936    OT Stop Time 1017    OT Time Calculation (min) 41 min    Activity Tolerance Patient tolerated treatment well    Behavior During Therapy Impulsive;Flat affect             Past Medical History:  Diagnosis Date   Diabetes [facility])    Past Surgical History:  Procedure Laterality Date   COLONOSCOPY     LASIK Bilateral    Patient Active Problem List   Diagnosis Date Noted   Adjustment disorder with depressed mood 10/07/2021   Parkinson's disease 09/25/2021   Sialorrhea 09/25/2021    ONSET DATE: Parkinsons Disease dx November 2022   REFERRING DIAG: G20.A1 (ICD-10-CM) - Parkinson's disease without dyskinesia, without mention of fluctuations  THERAPY DIAG:  Other lack of coordination  Other symptoms and signs involving the nervous system  Rationale for Evaluation and Treatment: Rehabilitation  SUBJECTIVE:   SUBJECTIVE STATEMENT: Pt reports he usually uses pens with grips to them.   Pt accompanied by: self  PERTINENT HISTORY: PMX: Adjustment disorder with depressed mood, DM, Lasik (OU), Cervical spondylosis with radiculopathy     Parkinsons Disease dx November 2022   PRECAUTIONS: Fall  WEIGHT BEARING RESTRICTIONS: No  PAIN:  Are you having pain? No  FALLS: Has patient fallen in last 6 months? 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   PATIENT GOALS: Manage PD symptoms  OBJECTIVE:   HAND DOMINANCE: Right  ADLs: Overall ADLs: mod I  UB Dressing: setup of buttons on shirt sleeves night before  IADLs:  Mostly mod I Handwriting: 90% legible  MOBILITY STATUS: Independent  POSTURE COMMENTS:  rounded shoulders and forward head  ACTIVITY TOLERANCE: Activity tolerance: good  FUNCTIONAL OUTCOME MEASURES: Fastening/unfastening 3 buttons: 18 seconds Physical performance test: PPT#2 (simulated eating) 16 seconds & PPT#4 (donning/doffing jacket): 12 seconds  seconds COORDINATION: 9 Hole Peg test: Right: 31 sec; Left: 42 sec Box and Blocks:  Right 40 blocks, Left 42blocks  UE ROM:   BUE - WNL  UE MMT:    BUE - WNL  SENSATION: Paresthesias in R index finger  MUSCLE TONE: BUE - WFL  COGNITION: Overall cognitive status: Within functional limits for tasks assessed  OBSERVATIONS: Dyskinesias   TODAY'S TREATMENT:             - Self-care/home management completed for duration as noted below including:  OT initiated BIG ADL strategies as noted in pt instructions to improve independence and safety with occupational performance.   - Therapeutic activities completed for duration as noted below including: Pt also educated on writing strategies as noted in pt instructions to improve legibility of handwriting.   - Therapeutic exercises completed for duration as noted below including:  Initiated PWR! Hands as noted in pt instructions for BUE ROM and coordination.  PATIENT EDUCATION: Education details: PWR! Hands; ADL strategies; writing strategies Person educated: Patient Education method: Explanation Education comprehension: verbalized understanding  HOME EXERCISE PROGRAM: 08/03/2023: PWR! Hands; ADL strategies; writing strategies  GOALS: SHORT TERM GOALS: Target date: 08/12/2023    Pt will be independent with PD specific  HEP.  Baseline: not yet initiated Goal status: IN PROGRESS  2.  Pt will verbalize understanding of adapted strategies to maximize safety and independence with ADLs/IADLs.  Baseline: not yet initiated Goal status: MET  LONG TERM GOALS: Target date: 09/10/2023  Pt will verbalize understanding of ways to prevent future PD related complications and PD community resources.  Baseline: not yet initiated Goal status: INITIAL  2.  Pt will verbalize understanding of ways to keep thinking skills sharp and ways to compensate for STM changes in the future.  Baseline: not yet initiated Goal status: INITIAL  3.  Patient will complete nine-hole peg with use of L in 34 seconds or less. Baseline: 42 seconds Goal status: INITIAL  ASSESSMENT:  CLINICAL IMPRESSION: Pt demonstrates good understanding of PD exercises as needed to progress towards goals. Recommend initiation of PWR! Moves to establish comprehensive program for PD management.   PERFORMANCE DEFICITS: in functional skills including ADLs, IADLs, coordination, Fine motor control, Gross motor control, and UE functional use IMPAIRMENTS: are limiting patient from ADLs, IADLs, work, and leisure.   COMORBIDITIES:  may have co-morbidities  that affects occupational performance. Patient will benefit from skilled OT to address above impairments and improve overall function.  REHAB POTENTIAL: Good  PLAN:  OT FREQUENCY: 1x/week  OT DURATION: 6 weeksReview  PLANNED INTERVENTIONS: self care/ADL training, therapeutic exercise, therapeutic activity, neuromuscular re-education, functional mobility training, patient/family education, coping strategies training, DME and/or AE instructions, and Re-evaluation  RECOMMENDED OTHER SERVICES: PT, ST (screen)  CONSULTED AND AGREED WITH PLAN OF CARE: Patient  PLAN FOR NEXT SESSION:  Review coordination HEP (start with coins)   Delana Meyer, OT 08/10/2023, 5:33 PM

## 2023-08-10 NOTE — Patient Instructions (Signed)
Coordination Exercises  Perform the following exercises for 20 minutes 1 times per day. Perform with both hand(s). Perform using big movements.  Flipping Cards: Place deck of cards on the table. Flip cards over by opening your hand big to grasp and then turn your palm up big, opening hand fully to release.  Deal cards: Hold 1/2 or whole deck in your hand. Use thumb to push card off top of deck with one big push.  Flip card between each finger.  Place card on tabletop.  Then flick fingers (extend fingers) powerfully to slide card off table (can have chair/box below table to catch the cards).  Rotate ball with fingertips: Pick up with fingers/thumb and move as much as you can with each turn/movement (clockwise and counter-clockwise).  Toss ball from one hand to the other: Toss big/high.  Deliberately open with toss and deliberately close hand after catch.  Toss ball in the air and catch with the same hand: Toss big/high.  Deliberately open with toss and deliberately close hand after catch.  Rotate 2 golf balls in your hand: Both directions.  Pick up coins and place in coin bank or container: Open hand big and pick up with big, intentional movements. Do not drag coin to the edge.  Pick up coins and stack one at a time: Open hand big and pick up with big, intentional movements. Do not drag coin to the edge. (5-10 in a stack)  Pick up 5-10 coins one at a time and hold in palm. Then, move coins from palm to fingertips one at time and place in coin bank/container.  Pick up 5-10 coins one at a time and hold in palm. Then, move coins from palm to fingertips one at a time to stack.  Practice writing: Slow down, write big, and focus on forming each letter.  Practice typing.

## 2023-08-16 DIAGNOSIS — M25551 Pain in right hip: Secondary | ICD-10-CM | POA: Diagnosis not present

## 2023-08-17 ENCOUNTER — Ambulatory Visit: Payer: BC Managed Care – PPO | Attending: Internal Medicine | Admitting: Occupational Therapy

## 2023-08-17 DIAGNOSIS — R471 Dysarthria and anarthria: Secondary | ICD-10-CM | POA: Diagnosis not present

## 2023-08-17 DIAGNOSIS — R29818 Other symptoms and signs involving the nervous system: Secondary | ICD-10-CM | POA: Diagnosis not present

## 2023-08-17 DIAGNOSIS — R278 Other lack of coordination: Secondary | ICD-10-CM | POA: Insufficient documentation

## 2023-08-17 NOTE — Therapy (Signed)
OUTPATIENT OCCUPATIONAL THERAPY PARKINSON'S TREATMENT  Patient Name: Robert Mcclain MRN: [id number] DOB:1961-04-30, 62 y.o., male Today's Date: 08/17/2023  PCP: Georgann Housekeeper, MD  REFERRING PROVIDER: Raquel Sarna, MD  END OF SESSION:  OT End of Session - 08/17/23 1612     Visit Number 4    Number of Visits 7    Date for OT Re-Evaluation 09/10/23    Authorization Type BCBS    OT Start Time 1620    OT Stop Time 1702    OT Time Calculation (min) 42 min    Activity Tolerance Patient tolerated treatment well    Behavior During Therapy Flat affect             Past Medical History:  Diagnosis Date   Diabetes [facility])    Past Surgical History:  Procedure Laterality Date   COLONOSCOPY     LASIK Bilateral    Patient Active Problem List   Diagnosis Date Noted   Adjustment disorder with depressed mood 10/07/2021   Parkinson's disease (HCC) 09/25/2021   Sialorrhea 09/25/2021    ONSET DATE: Parkinsons Disease dx November 2022   REFERRING DIAG: G20.A1 (ICD-10-CM) - Parkinson's disease without dyskinesia, without mention of fluctuations  THERAPY DIAG:  Other lack of coordination  Other symptoms and signs involving the nervous system  Dysarthria and anarthria  Rationale for Evaluation and Treatment: Rehabilitation  SUBJECTIVE:   SUBJECTIVE STATEMENT: Pt reports he has been completing PWR! Hands and working on coordination exercises though still finds them challenging.   Pt accompanied by: self  PERTINENT HISTORY: PMX: Adjustment disorder with depressed mood, DM, Lasik (OU), Cervical spondylosis with radiculopathy     Parkinsons Disease dx November 2022   PRECAUTIONS: Fall  WEIGHT BEARING RESTRICTIONS: No  PAIN:  Are you having pain? No  FALLS: Has patient fallen in last 6 months? No  LIVING ENVIRONMENT: Lives with: lives with their spouse Lives in: House/apartment Stairs: Yes: Internal: 12 steps; bilateral but cannot reach both and External: 2-5 steps;  none Has following equipment at home: None  PLOF: Independent; driving; [employer] requiring driving and lots of typing.   PATIENT GOALS: Manage PD symptoms  OBJECTIVE:   HAND DOMINANCE: Right  ADLs: Overall ADLs: mod I  UB Dressing: setup of buttons on shirt sleeves night before  IADLs:  Mostly mod I Handwriting: 90% legible  MOBILITY STATUS: Independent  POSTURE COMMENTS:  rounded shoulders and forward head  ACTIVITY TOLERANCE: Activity tolerance: good  FUNCTIONAL OUTCOME MEASURES: Fastening/unfastening 3 buttons: 18 seconds Physical performance test: PPT#2 (simulated eating) 16 seconds & PPT#4 (donning/doffing jacket): 12 seconds  seconds COORDINATION: 9 Hole Peg test: Right: 31 sec; Left: 42 sec Box and Blocks:  Right 40 blocks, Left 42blocks  UE ROM:   BUE - WNL  UE MMT:    BUE - WNL  SENSATION: Paresthesias in R index finger  MUSCLE TONE: BUE - WFL  COGNITION: Overall cognitive status: Within functional limits for tasks assessed  OBSERVATIONS: Dyskinesias   TODAY'S TREATMENT:          - Therapeutic exercises completed for duration as noted below including:  OT initiated additional coordination HEP as noted in pt instructions. Pt required cues to slow pace and complete with BIG, deliberate movements.   Initiated PWR! Standing as noted in pt instructions for balance and large, deliberate movements.  Pt used PWR! Rock in standing and coordination exercise to flip cards one at a time at counter level.   PATIENT EDUCATION: Education  details: PWR! Standing; Coordination HEP Person educated: Patient Education method: Explanation, Demonstration, and Handouts Education comprehension: verbalized understanding, returned demonstration, and needs further education  HOME EXERCISE PROGRAM: 08/03/2023: PWR! Hands; ADL strategies; writing strategies 08/10/2023: Coordination HEP  GOALS: SHORT TERM GOALS: Target date: 08/12/2023    Pt  will be independent with PD specific HEP.  Baseline: not yet initiated Goal status: IN PROGRESS  2.  Pt will verbalize understanding of adapted strategies to maximize safety and independence with ADLs/IADLs.  Baseline: not yet initiated Goal status: MET  LONG TERM GOALS: Target date: 09/10/2023  Pt will verbalize understanding of ways to prevent future PD related complications and PD community resources.  Baseline: not yet initiated Goal status: INITIAL  2.  Pt will verbalize understanding of ways to keep thinking skills sharp and ways to compensate for STM changes in the future.  Baseline: not yet initiated Goal status: INITIAL  3.  Patient will complete nine-hole peg with use of L in 34 seconds or less. Baseline: 42 seconds Goal status: INITIAL  ASSESSMENT:  CLINICAL IMPRESSION: Pt demonstrates good understanding of PD exercises as needed to progress towards goals. Recommend initiation of PWR! Moves to establish comprehensive program for PD management.   PERFORMANCE DEFICITS: in functional skills including ADLs, IADLs, coordination, Fine motor control, Gross motor control, and UE functional use IMPAIRMENTS: are limiting patient from ADLs, IADLs, work, and leisure.   COMORBIDITIES:  may have co-morbidities  that affects occupational performance. Patient will benefit from skilled OT to address above impairments and improve overall function.  REHAB POTENTIAL: Good  PLAN:  OT FREQUENCY: 1x/week  OT DURATION: 6 weeksReview  PLANNED INTERVENTIONS: self care/ADL training, therapeutic exercise, therapeutic activity, neuromuscular re-education, functional mobility training, patient/family education, coping strategies training, DME and/or AE instructions, and Re-evaluation  RECOMMENDED OTHER SERVICES: PT, ST (screen)  CONSULTED AND AGREED WITH PLAN OF CARE: Patient  PLAN FOR NEXT SESSION:  Review PWR! Moves; EX chart   Delana Meyer, OT 08/17/2023, 5:30 PM

## 2023-08-24 ENCOUNTER — Ambulatory Visit: Payer: BC Managed Care – PPO | Admitting: Occupational Therapy

## 2023-08-24 DIAGNOSIS — R278 Other lack of coordination: Secondary | ICD-10-CM

## 2023-08-24 DIAGNOSIS — R29818 Other symptoms and signs involving the nervous system: Secondary | ICD-10-CM

## 2023-08-24 DIAGNOSIS — R471 Dysarthria and anarthria: Secondary | ICD-10-CM | POA: Diagnosis not present

## 2023-08-24 NOTE — Therapy (Signed)
OUTPATIENT OCCUPATIONAL THERAPY PARKINSON'S TREATMENT AND DISCHARGE  Patient Name: Robert Mcclain MRN: [id number] DOB:12-Sep-1961, 62 y.o., male Today's Date: 08/24/2023  OCCUPATIONAL THERAPY DISCHARGE SUMMARY  Visits from Start of Care: 5  Current functional level related to goals / functional outcomes: Patient has met all short-term long-term goals to date.   Remaining deficits: Pt remains limited by Parkinson's, which affects his motor movements putting him at increased need for assistance and falls; however, he has resources and HEP as needed to maintain his current level of function as much as possible.    Education / Equipment: Continue with HEP and Parkinson's management strategies following OT d/c. Return for Parkinson's screen in 6 months as scheduled.    Patient agrees to discharge. Patient goals were met. Patient is being discharged due to being pleased with the current functional level.Marland Kitchen   PCP: Georgann Housekeeper, MD  REFERRING PROVIDER: Raquel Sarna, MD  END OF SESSION:  OT End of Session - 08/24/23 [phone]     Visit Number 5    Number of Visits 7    Date for OT Re-Evaluation 09/10/23    Authorization Type BCBS    OT Start Time [phone]    OT Stop Time 1013    OT Time Calculation (min) 35 min    Activity Tolerance Patient tolerated treatment well    Behavior During Therapy Flat affect             Past Medical History:  Diagnosis Date   Diabetes [facility])    Past Surgical History:  Procedure Laterality Date   COLONOSCOPY     LASIK Bilateral    Patient Active Problem List   Diagnosis Date Noted   Adjustment disorder with depressed mood 10/07/2021   Parkinson's disease (HCC) 09/25/2021   Sialorrhea 09/25/2021    ONSET DATE: Parkinsons Disease dx November 2022   REFERRING DIAG: G20.A1 (ICD-10-CM) - Parkinson's disease without dyskinesia, without mention of fluctuations  THERAPY DIAG:  Other lack of coordination  Other symptoms and signs involving the  nervous system  Dysarthria and anarthria  Rationale for Evaluation and Treatment: Rehabilitation  SUBJECTIVE:   SUBJECTIVE STATEMENT: Pt reports he feels ready for discharge this visit.   Pt accompanied by: self  PERTINENT HISTORY: PMX: Adjustment disorder with depressed mood, DM, Lasik (OU), Cervical spondylosis with radiculopathy     Parkinsons Disease dx November 2022   PRECAUTIONS: Fall  WEIGHT BEARING RESTRICTIONS: No  PAIN:  Are you having pain? No  FALLS: Has patient fallen in last 6 months? No  LIVING ENVIRONMENT: Lives with: lives with their spouse Lives in: House/apartment Stairs: Yes: Internal: 12 steps; bilateral but cannot reach both and External: 2-5 steps; none Has following equipment at home: None  PLOF: Independent; driving; [employer] requiring driving and lots of typing.   PATIENT GOALS: Manage PD symptoms  OBJECTIVE:   HAND DOMINANCE: Right  ADLs: Overall ADLs: mod I  UB Dressing: setup of buttons on shirt sleeves night before  IADLs:  Mostly mod I Handwriting: 90% legible  MOBILITY STATUS: Independent  POSTURE COMMENTS:  rounded shoulders and forward head  ACTIVITY TOLERANCE: Activity tolerance: good  FUNCTIONAL OUTCOME MEASURES: Fastening/unfastening 3 buttons: 18 seconds Physical performance test: PPT#2 (simulated eating) 16 seconds & PPT#4 (donning/doffing jacket): 12 seconds  seconds COORDINATION: 9 Hole Peg test: Right: 31 sec; Left: 42 sec Box and Blocks:  Right 40 blocks, Left 42blocks  UE ROM:   BUE - WNL  UE MMT:    BUE -  WNL  SENSATION: Paresthesias in R index finger  MUSCLE TONE: BUE - WFL  COGNITION: Overall cognitive status: Within functional limits for tasks assessed  OBSERVATIONS: Dyskinesias   TODAY'S TREATMENT:          - Self-care/home management completed for duration as noted below including: Objective measures assessed as noted in Goals section to determine progression  towards goals. Therapist reviewed goals with patient and updated patient progression.  No additional functional limitations identified. OT initiated use of exercise chart to keep on track of HEP as noted in pt instructions. Education provided on keeping thinking skills sharp, preventing PD complications, and community resources.   Initiated PWR! Supine as noted in pt instructions for emphasizing large, deliberate movements.   PATIENT EDUCATION: Education details: PWR! Supine; EX chart; PD complications, PD resources, and keeping thinking skills sharp Person educated: Patient Education method: Explanation, Demonstration, and Handouts Education comprehension: verbalized understanding and returned demonstration  HOME EXERCISE PROGRAM: 08/03/2023: PWR! Hands; ADL strategies; writing strategies 08/10/2023: Coordination HEP 08/24/2023: PWR! Supine; EX chart; PD complications, PD resources, and keeping thinking skills sharp  GOALS: SHORT TERM GOALS: Target date: 08/12/2023    Pt will be independent with PD specific HEP.  Baseline: not yet initiated Goal status: MET  2.  Pt will verbalize understanding of adapted strategies to maximize safety and independence with ADLs/IADLs.  Baseline: not yet initiated Goal status: MET  LONG TERM GOALS: Target date: 09/10/2023  Pt will verbalize understanding of ways to prevent future PD related complications and PD community resources.  Baseline: not yet initiated Goal status: MET  2.  Pt will verbalize understanding of ways to keep thinking skills sharp and ways to compensate for STM changes in the future.  Baseline: not yet initiated Goal status: MET  3.  Patient will complete nine-hole peg with use of L in 34 seconds or less. Baseline: 42 seconds 08/24/2023: 32 seconds Goal status: MET  ASSESSMENT:  CLINICAL IMPRESSION: Pt has met all goals this date. Patient is appropriate for discharge and no longer demonstrates medical necessity for  continued skilled occupational services. Recommend PD screens with OT, PT, and ST in 6 months.  PERFORMANCE DEFICITS: in functional skills including ADLs, IADLs, coordination, Fine motor control, Gross motor control, and UE functional use IMPAIRMENTS: are limiting patient from ADLs, IADLs, work, and leisure.   COMORBIDITIES:  may have co-morbidities  that affects occupational performance. Patient will benefit from skilled OT to address above impairments and improve overall function.  REHAB POTENTIAL: Good  PLAN:  OT D/C completed   Delana Meyer, OT 08/24/2023, 12:06 PM

## 2023-08-24 NOTE — Patient Instructions (Addendum)
Ways to prevent future Parkinson's related complications:  1.   Exercise regularly/daily.    2.   Focus on BIGGER movements during daily activities- really reach overhead, straighten elbows and extend fingers  3.   When dressing (especially jacket/coat) or reaching for your seatbelt make sure to use your body to assist by twisting while you reach and looking at where you are reaching - this can help to minimize stress on the shoulder and reduce the risk of a rotator cuff tear  4.   Swing your arms when you walk (unless using walker)! People with PD are at increased risk for frozen shoulder and swinging your arms can reduce this risk.  5. Drink plenty of water and eat a high fiber diet (fresh fruits and veggies, nuts/seeds, fish). Avoid canned foods, red meats, and dairy when possible  6. Do NOT take your Parkinson's medication around meals (avoid taking 30 min before a meal, or 1 hour after a meal), especially protein as it blocks the absorption of the medicine and will not work effectively  7. Keep your feet apart when you are standing (wider stance) to allow you to have better balance and to reach further with your arms. Also make sure your feet are apart before standing up.    Keeping Thinking Skills Sharp:  1. Jigsaw puzzles  2. Card/board games  3. Talking on the phone/social events  4. [email or web address].com  5. Online games  6. Word serches/crossword puzzles  7.  Logic puzzles  8. Aerobic exercise (stationary bike)  9. Eating balanced diet (fruits & veggies)  10. Drink water  11. Try something new--new recipe, hobby  12. Crafts  13. Do a variety of activities that are challenging  14.  Plan weekly meals and write a grocery list  15. Add cognitive activities to walking/exercising (think of animal/food/city with each letter of the alphabet, counting backwards, thinking of as many vegetables as you can, etc.).--Only do this  If safe (no freezing/falls).       Community  Parkinson's Exercise Programs    Parkinson's Wellness Recovery Exercise Programs:    PWR! Moves PD Exercise Class:  This is a therapist-led exercise class for people with Parkinson's disease in the [place] community. It consists of a one-hour exercise class each week. Classes are offered in eight-week sessions, and the cost per session is $80. Class size is limited to a maximum of 20 participants. Participant criteria includes: Participant must be able to get up and down from the floor with minimal to no assistance, have had 0-1 falls in the past 6 months, and have completed physical or occupational therapy at [facility] within the past year.   To find out more about session dates, questions, or to register, please contact Lonia Blood, Physical Therapist, or Modena Morrow, Physical Environmental health practitioner, at [facility] at [phone].   PWR! Circuit Class:  This is a therapist-led exercise class with intervals of circuit activities incorporating PWR! Moves into functional activities. It consists of one 45-minute exercise class per week. Classes are offered in eight-week sessions, and the cost per session is $120. Class size is limited to a maximum of eight participants to allow for hands-on instruction. Participant criteria: class is ideal for people with Parkinson's disease who have completed PWR! Moves Exercise Class or who are currently independently exercising and want to be challenged, must be able to walk independently with 0-1 falls in the past 6 months, able to get up and down  from the floor independently, able to sit to stand independently, and able to jog 20 feet.    To find out more about session dates, questions, or to register, please contact Lonia Blood, Physical Therapist, or Modena Morrow, Physical Environmental health practitioner, at [facility] at [phone].    [employer] Parkinson's Cycle:    Parkinson's  Cycle Class at [facility] This is an ongoing class on Monday and Thursday mornings at 10:45 a.m. A healthcare provider referral is required to enroll. This class is FREE to participants, and you do not have to be a member of the [employer] to enroll. Contact Beth at [phone] or [email or web address]. Parkinson's Cycle Class at [facility] Ongoing Class Monday, Wednesday, and Friday mornings at 9:00 a.m. A healthcare provider referral is required to enroll. This class is FREE to participants, and you do not have to be a member of the [employer] to enroll. Contact Marlee at [phone] or [email or web address]. Parkinson's Cycle Class at [facility] Ongoing Class every Friday mornings at 12 p.m.  A healthcare provider referral is required to enroll. This class is FREE to participants, and you do not have to be a member of the [employer] to enroll. Contact [phone].  Parkinson's Cycle Class at [facility] Ongoing Class every Monday at 12pm.  A healthcare provider referral is required to enroll. This class is FREE to participants, and you do not have to be a member of the [employer] to enroll. Contact Raynelle Fanning at [phone] or  [email or web address].    [employer] Boxing:  [employer]  Classes are offered Mondays at 5:15 p.m. and Tuesdays and Thursdays at 12 p.m. at [employer]. For more information, contact [phone] or visit [email or web address] or [email or web address]. [employer] [place] Classes are offered Monday, Wednesday, and Friday from 9:30 a.m. - 11:00 a.m. For more information, contact [phone] or [phone] or email [email or web address] or visit [email or web address] or [email or web address]. [employer] (classes are offered at 2 locations) Orlene Plum in [place] (for more information, contact Florencia Reasons at [phone] or email  [email or web address] [facility] at [facility] (class is open to the public -- for more information, contact Arn Medal at [phone] or email [email or web address]) Otho Bellows [place] Classes are held at [facility] in [place], [place]. For more information, call Dr. Ventura Bruns at [phone] or [email or web address].    Personal Training for Parkinson's:   [employer] Offers certified personal training to customize a program to meet your exercise needs to address Parkinson's disease. For more information, contact [phone] or visit [email or web address].   Community Dance for Parkinson's:   Community dance class for people with Parkinson's Disease Wednesdays at 9 a.m. [employer] of [address], [place] [address] Please contact Arrie Senate [phone] for more information   Scholarships Available for Fitness Programs:  The [employer] for [employer] is a non-profit 501(C)3 organization run by volunteers, whose mission is to strive to empower those living with Parkinson's Disease (PD), Progressive Supra-Nuclear Palsy (PSP) and Multiple System Atrophy (MSA).  Through financial support, recipients benefit from individual and group programs. [phone] [email or web address]     Powering Together for Parkinson's & Movement Disorders  The [place] Parkinson's and Movement Disorders team know that living well with a movement disorder extends far beyond our clinic walls. We are together with you. Our team is passionate about providing resources to you and your loved ones who are living with Parkinson's disease and  movement disorders. Participate in these programs and join our community. These resources are free or low cost!    [place] Parkinson's and Movement Disorders Program is adding:   Innovative educational programs for patients and caregivers.   Support groups for patients and caregivers living with  Parkinson's disease.   Parkinson's specific exercise programs.   Custom tailored therapeutic programs that will benefit patient's living with Parkinson's disease.    We are in this together. You can help and contribute to grow these programs and resources in our community. 100% of the funds donated to the Movement Disorders Fund stays right here in our community to support patients and their caregivers.  To make a tax deductible contribution:  -ask for a Power Together for Parkinson's envelope in the office today.  - call the Office of Institutional Advancement at [phone].

## 2023-08-31 ENCOUNTER — Encounter: Payer: BC Managed Care – PPO | Admitting: Occupational Therapy

## 2023-09-01 DIAGNOSIS — R059 Cough, unspecified: Secondary | ICD-10-CM | POA: Diagnosis not present

## 2023-09-01 DIAGNOSIS — M25551 Pain in right hip: Secondary | ICD-10-CM | POA: Diagnosis not present

## 2023-09-01 DIAGNOSIS — M519 Unspecified thoracic, thoracolumbar and lumbosacral intervertebral disc disorder: Secondary | ICD-10-CM | POA: Diagnosis not present

## 2023-09-07 ENCOUNTER — Encounter: Payer: BC Managed Care – PPO | Admitting: Occupational Therapy

## 2023-09-08 DIAGNOSIS — M25551 Pain in right hip: Secondary | ICD-10-CM | POA: Diagnosis not present

## 2023-10-04 DIAGNOSIS — Z5181 Encounter for therapeutic drug level monitoring: Secondary | ICD-10-CM | POA: Diagnosis not present

## 2023-10-04 DIAGNOSIS — Z0000 Encounter for general adult medical examination without abnormal findings: Secondary | ICD-10-CM | POA: Diagnosis not present

## 2023-10-04 DIAGNOSIS — Z125 Encounter for screening for malignant neoplasm of prostate: Secondary | ICD-10-CM | POA: Diagnosis not present

## 2023-10-04 DIAGNOSIS — E782 Mixed hyperlipidemia: Secondary | ICD-10-CM | POA: Diagnosis not present

## 2023-10-04 DIAGNOSIS — E1169 Type 2 diabetes mellitus with other specified complication: Secondary | ICD-10-CM | POA: Diagnosis not present

## 2023-11-23 DIAGNOSIS — R1310 Dysphagia, unspecified: Secondary | ICD-10-CM | POA: Diagnosis not present

## 2023-11-23 DIAGNOSIS — G20A1 Parkinson's disease without dyskinesia, without mention of fluctuations: Secondary | ICD-10-CM | POA: Diagnosis not present

## 2023-11-23 NOTE — Progress Notes (Signed)
 [name] is a 63 y.o. right handed male who first noticed drooling around 2-3 years ago (age [age]) as well as some mild slowness of movements noticed by his PCP on annual exam. On direct questioning, his wife, Robert Mcclain, had noticed him getting slower in the past 2-3 years as well and she also felt that his walking is slower than before. She also noticed that his voice is softer. Patient was referred to Dr. Asberry Tat at [facility] who diagnosed him with Parkinson's disease in 2023.    Medications of relevance Carbidopa/levodopa 25/100 1.5 - 1.5 - 1 tablet   Since last visit [name] has been doing fairly well. He returns with his wife today.  He reports that he has has increased drooling and has noticed a cough when he tends to get choked. He reports that he has had a cough for awhile, maybe a year.   His wife reports that she has noticed hypomimia from the patient. He additionally notes that he is more withdrawn and slow in conversation. He notes additionally that his movements are slower.  Carbidopa/levodopa 25/100 1.5 - 1.5 - 1. He endorses connecting doses. His wife endorses that she believes that he could benefit from an increase in his dose, as she has noticed increased slowness of movement and more changes to his behavior.   He endorses that he feels cognitively a little slower and his friends have mentioned noticing this also.   He has continued physical therapy with [facility] twice per week.   NEUROIMAGING STUDIES: none available for my review  Outside reports: Available records were personally reviewed by me and details of history and medications changes that were done in the past were noted.   Past Medical History:  Diagnosis Date  . Acid reflux   . Anxiety   . Cervical spondylosis with radiculopathy   . Diabetes mellitus (CMS-HCC)    Past Surgical History:  Procedure Laterality Date  . cyst removed     Family History  Problem Relation Age of Onset   . Stroke Mother   . Hypertension Father   . Aneurysm Father    Social History   Socioeconomic History  . Marital status: Married  Tobacco Use  . Smoking status: Former    Types: Cigars  . Smokeless tobacco: Never  Substance and Sexual Activity  . Alcohol use: Yes    Alcohol/week: 12.0 standard drinks of alcohol    Types: 12 Cans of beer per week  . Drug use: Never  Other Topics Concern  . Exercise Yes  . Living Situation Yes    Scheduled Meds: Prior to Admission medications   Medication Sig Start Date End Date Taking? Authorizing Provider  ALPRAZolam (XANAX) 0.5 MG tablet  04/08/18  Yes Historical Provider, MD  ascorbic acid, vitamin C, (VITAMIN C) 500 MG tablet Take 500 mg by mouth.   Yes Historical Provider, MD  b complex vitamins capsule Take 1 capsule by mouth.   Yes Historical Provider, MD  diclofenac (VOLTAREN) 50 MG EC tablet  06/14/17  Yes Historical Provider, MD  famotidine (PEPCID ORAL) Take by mouth.   Yes Historical Provider, MD  finasteride (PROSCAR) 5 mg tablet  04/08/18  Yes Historical Provider, MD  loratadine (CLARITIN) 10 mg tablet Take by mouth.   Yes Historical Provider, MD  metFORMIN (GLUCOPHAGE) 850 MG tablet  06/14/17  Yes Historical Provider, MD  multivitamin (TAB-A-VITE/THERAGRAN) per tablet Take 1 tablet by mouth.   Yes Historical Provider, MD  sildenafil (VIAGRA) 100 MG tablet Take 100 mg by mouth.   Yes Historical Provider, MD  aspirin (ECOTRIN) 81 MG tablet Take by mouth.    Historical Provider, MD  omeprazole (PRILOSEC) 20 MG capsule Take by mouth.    Historical Provider, MD    No Known Allergies  Review of Systems:  12  Systems were  review and were negative except for pertinent items noted in the HPI.  Objective: Vital signs in last 24 hours: There were no vitals taken for this visit. There were no vitals filed for this visit.   GENERAL MEDICAL EXAMINATION: Head was normocephalic and atraumatic, neck was supple with full range of motion.  No edema at BLEs.  NEUROLOGICAL EXAMINATION: Mental Status: alert, oriented, with fluent speech, normal language and comprehension.    MOVEMENT DISORDERS EXAMINATION  Facial expression was mild hypomimia  Volume of speech was normal . There was no rest tremor in  upper extremities and lower extremities. There was mild postural tremor in upper extremities bilaterally. There was no action tremor in upper extremities bilaterally. Tone was normal in the neck and tone was mildly increased in the left upper and lower extremities.  Rapid alternating movements were mildly decreased in amplitude and speed with mild breakdown in bilaterally, left more than right.  Foot tap was mild decrement in amplitude and rhythm bilaterally, left worse than right. He could get up from the chair without help of both hands. Gait was normal - based and steady with good heel to toe stride.  Arm swing was deminished on the left and normal on the right.  Pull test was normal  .  Assessment/Plan: In summary, [name] presents with Parkinson's disease, complicated by slowness of movements, drooling and hypophonia but without postural instability (left body is more affected). Patient was then started on Carbidopa/levodopa 25/100 with good response.  We discussed the symptoms and treatment options at length with the patient and his wife. It appears at that this time his current dose of levodopa is very beneficial for his ADLs, though we would like to trial an increase in dose. He will increase Carbidopa/levodopa 25/100 to 2 tabs - 1.5 tabs - 1.5 tabs for his movement.   For cognition, we will begin a trial of Rivastigmine, titrating up to 3 mg twice per day.   We discussed drooling in the setting of Parkinson's disease. Should this become bothersome we can discuss interventions such as botox injections.  I have referred the patient for [facility]. I encouraged him to do daily at home speech exercises with the  Parkinson's Voice [employer] website to maintain volume of speech. Referral placed to swallow clinic for cough and dysphagia.  I have referred the patient to [facility] for balance and gait.   I encouraged regular aerobic exercises 2-3 times per week about 20 min per day as such exercises shown to be beneficial for general health in patients with Parkinson's disease. The choice of exercise is up to Mr.Robert Mcclain and we had discussed what resources are available for him in regards of exercises.   Total face - to - face time included 55 minutes > 50 % in direct consultation reviewing details of history, examination and data findings , discussing my clinical reasoning and clinical impression, as well as a review of my recommendations for a plan of treatment as documented above. Additional time was provided to address alternative options of care, health literacy regarding the differential diagnosis, testing and medication  options, the benefits of current plan as well as an opportunity to address all the patient's questions and concerns.   A written summary was provided to the patient at the time of the visit, as well as instructions on how to access My Chart. My contact information was provided along with instructions on how to reach the administrative office and the clinic.  He will follow up at already scheduled visit.  Scribe's Attestation: Harlene Louis, NP obtained and performed the history, physical exam and medical decision making elements that were entered into the chart. Signed by Jolena Farr, Scribe, on November 23, 2023 at 3:01 PM.  ---------------------------------------------------------------------------------------------------------------------- November 28, 2023 9:56 PM. Documentation assistance provided by the Scribe. I was present during the time the encounter was recorded. The information recorded by the Scribe was done at my direction and has been reviewed and validated by  me.  Harlene CHRISTELLA Louis, FNP ----------------------------------------------------------------------------------------------------------------------

## 2023-12-07 ENCOUNTER — Ambulatory Visit: Payer: BC Managed Care – PPO | Admitting: Physical Therapy

## 2023-12-10 DIAGNOSIS — M5431 Sciatica, right side: Secondary | ICD-10-CM | POA: Diagnosis not present

## 2023-12-10 DIAGNOSIS — R2689 Other abnormalities of gait and mobility: Secondary | ICD-10-CM | POA: Diagnosis not present

## 2023-12-10 DIAGNOSIS — G20A1 Parkinson's disease without dyskinesia, without mention of fluctuations: Secondary | ICD-10-CM | POA: Diagnosis not present

## 2023-12-16 DIAGNOSIS — L853 Xerosis cutis: Secondary | ICD-10-CM | POA: Diagnosis not present

## 2023-12-16 DIAGNOSIS — L820 Inflamed seborrheic keratosis: Secondary | ICD-10-CM | POA: Diagnosis not present

## 2023-12-16 DIAGNOSIS — L603 Nail dystrophy: Secondary | ICD-10-CM | POA: Diagnosis not present

## 2023-12-16 DIAGNOSIS — M7130 Other bursal cyst, unspecified site: Secondary | ICD-10-CM | POA: Diagnosis not present

## 2023-12-17 DIAGNOSIS — G20A1 Parkinson's disease without dyskinesia, without mention of fluctuations: Secondary | ICD-10-CM | POA: Diagnosis not present

## 2023-12-17 DIAGNOSIS — M5431 Sciatica, right side: Secondary | ICD-10-CM | POA: Diagnosis not present

## 2023-12-17 DIAGNOSIS — R2689 Other abnormalities of gait and mobility: Secondary | ICD-10-CM | POA: Diagnosis not present

## 2023-12-31 DIAGNOSIS — G20A1 Parkinson's disease without dyskinesia, without mention of fluctuations: Secondary | ICD-10-CM | POA: Diagnosis not present

## 2023-12-31 DIAGNOSIS — M5431 Sciatica, right side: Secondary | ICD-10-CM | POA: Diagnosis not present

## 2023-12-31 DIAGNOSIS — R2689 Other abnormalities of gait and mobility: Secondary | ICD-10-CM | POA: Diagnosis not present

## 2024-01-07 DIAGNOSIS — M5431 Sciatica, right side: Secondary | ICD-10-CM | POA: Diagnosis not present

## 2024-01-07 DIAGNOSIS — R2689 Other abnormalities of gait and mobility: Secondary | ICD-10-CM | POA: Diagnosis not present

## 2024-01-07 DIAGNOSIS — G20A1 Parkinson's disease without dyskinesia, without mention of fluctuations: Secondary | ICD-10-CM | POA: Diagnosis not present

## 2024-02-14 ENCOUNTER — Other Ambulatory Visit: Payer: Self-pay | Admitting: Internal Medicine

## 2024-02-14 DIAGNOSIS — M5116 Intervertebral disc disorders with radiculopathy, lumbar region: Secondary | ICD-10-CM

## 2024-02-18 ENCOUNTER — Ambulatory Visit
Admission: RE | Admit: 2024-02-18 | Discharge: 2024-02-18 | Disposition: A | Discharge status: Home / Self Care | Source: Ambulatory Visit | Attending: Internal Medicine | Admitting: Internal Medicine

## 2024-02-18 DIAGNOSIS — M5116 Intervertebral disc disorders with radiculopathy, lumbar region: Secondary | ICD-10-CM

## 2024-02-21 NOTE — Therapy (Deleted)
Occupational Therapy Parkinson's Disease Screen  Hand dominance:  ***   Physical Performance Test item #2 (simulated eating):  *** sec  Physical Performance Test item #4 (donning/doffing jacket):  *** sec  Fastening/unfastening 3 buttons in:  ***sec  9-hole peg test:    RUE  *** sec        LUE  *** sec  Box & Blocks Test:   RUE  *** blocks        LUE  *** blocks  Standing Functional Reach Test:   RUE  *** inches        LUE  *** inches  Change in ability to perform ADLs/IADLs:  ***  Other Comments:  ***  Pt would benefit from occupational therapy evaluation due to  ***  Pt does not require occupational therapy services at this time.  Recommended occupational therapy screen in   ***

## 2024-02-22 ENCOUNTER — Ambulatory Visit: Payer: BC Managed Care – PPO | Admitting: Physical Therapy

## 2024-02-22 ENCOUNTER — Ambulatory Visit: Payer: BC Managed Care – PPO | Admitting: Occupational Therapy

## 2024-02-22 ENCOUNTER — Ambulatory Visit: Payer: BC Managed Care – PPO

## 2024-03-20 NOTE — Therapy (Signed)
 [facility] [address] [place], [place], [address] Phone: [phone]   Fax:  [phone]  Patient Details  Name: Robert Mcclain MRN: [id number] Date of Birth: [birth year] Referring Provider:  Jearldine Mina, MD  Encounter Date: 03/23/2024  Occupational Therapy Parkinson's Disease Screen  Hand dominance:  Rt   Physical Performance Test item #2 (simulated eating):  14 sec (did 2 together)  Physical Performance Test item #4 (donning/doffing jacket):  13.26 sec  Fastening/unfastening 3 buttons in:  20 sec (on his own shirt) - slightly slower  9-hole peg test:    RUE  35.75 sec (slower)       LUE  41 sec (slower from last d/c)   Change in ability to perform ADLs/IADLs:  denies change, mild micrographia, has sciatic nerve problems  Other Comments:  BUE AROM WFL's w/ mild stiffness   Pt would benefit from occupational therapy evaluation due to slight decline in coordination and function. Pt still working therefore does not want to come more than 1x/wk      Velinda Getting, OT 03/20/2024, 1:25 PM  [facility] [address] [place], [place], [address] Phone: [phone]   Fax:  [phone]

## 2024-03-21 NOTE — Therapy (Unsigned)
 [facility] [address] [place], [place], [address] Phone: [phone]   Fax:  [phone]  Patient Details  Name: Robert Mcclain MRN: [id number] Date of Birth: [birth year] Referring Provider:  Jearldine Mina, MD  Encounter Date: 03/23/2024  Speech Therapy Parkinson's Disease Screen   Decibel Level today: 72 dB  (WNL=70-72 dB) with sound level meter 30cm away from pt's  mouth. Pt's conversational volume has maintained since last treatment course. Pt continues to endorse daily speech warm up and positive comments from friends/family regarding speech.   Pt does not report difficulty with swallowing, which does not warrant further evaluation. Pt recently followed with [facility] for MBS demonstrating intact safety of swallow, mildly impaired efficiency. Was provided with HEP.   Pt does does not require speech therapy services at this time. Recommend ST screen in another 6 months  Robert Mcclain, CCC-SLP 03/21/2024, 12:49 PM  [facility] [address] [place], [place], [address] Phone: [phone]   Fax:  [phone]

## 2024-03-23 ENCOUNTER — Ambulatory Visit: Admitting: Occupational Therapy

## 2024-03-23 ENCOUNTER — Telehealth: Payer: Self-pay | Admitting: Occupational Therapy

## 2024-03-23 ENCOUNTER — Ambulatory Visit: Admitting: Physical Therapy

## 2024-03-23 ENCOUNTER — Ambulatory Visit: Attending: Internal Medicine | Admitting: Speech Pathology

## 2024-03-23 DIAGNOSIS — R278 Other lack of coordination: Secondary | ICD-10-CM

## 2024-03-23 DIAGNOSIS — R29818 Other symptoms and signs involving the nervous system: Secondary | ICD-10-CM | POA: Insufficient documentation

## 2024-03-23 DIAGNOSIS — R471 Dysarthria and anarthria: Secondary | ICD-10-CM | POA: Insufficient documentation

## 2024-03-23 NOTE — Therapy (Signed)
 [facility] [address] [place], [place], [address] Phone: [phone]   Fax:  [phone]  Patient Details  Name: Robert Mcclain MRN: [id number] Date of Birth: 1961-11-15 Referring Provider:  Jearldine Mina, MD  Encounter Date: 03/23/2024  Physical Therapy Parkinson's Disease Screen   Timed Up and Go test: 7.6 seconds (previously 7.69 seconds)   10 meter walk test:8.1 seconds = 4.0 ft/sec with antalgic pattern with RLE (previously 7.41 seconds = 4.26 ft/sec)  5 time sit to stand test:  10.2 seconds (previously 10.5 seconds with no UE support)  Has been having some sciatic nerve issues to his RLE. Notes it is painful. Going in for a consultation to see about doing epidural injections. Has had no falls. Changes in walking due to sciatic pain, prior was walking fine before that. Has not been able to go to the gym because of his sciatic pain.    Patient does not require Physical Therapy services at this time - will wait until after pt gets injections for RLE sciatic pain and then determine if pt would benefit from PT. Pt in agreement with this plan.     Seabron Cypress, PT, DPT 03/23/2024, 8:21 AM  [facility] [address] [place], [place], [address] Phone: [phone]   Fax:  [phone]

## 2024-03-23 NOTE — Telephone Encounter (Signed)
 Dr. Gerardine Knock, Menzo Braam was screened by PT/OT/speech on 03/23/24.  The patient would benefit from PT and OT evaluations for Parkinson's disease due to decline in function, coordination, and balance.    If you agree, please place an order to [facility] on [address] via EPIC or fax the order to [phone]. Thank you, Gwenette Lennox, OTR/L  [facility] [address] [place], [place]  [address] Phone:  [phone] Fax:  [phone]

## 2024-04-05 ENCOUNTER — Telehealth: Payer: Self-pay | Admitting: Occupational Therapy

## 2024-04-05 NOTE — Telephone Encounter (Signed)
 Called the office of Dr. Laurice Pope (neurologist) to make 3rd attempt to get OT/PT referrals to this clinic. (Requests had been routed x 2 via EPIC w/ no response, then requested via fax yesterday, however referral was sent back for speech therapy instead of OT/PT as requested). Spoke with front office staff today to get correct referrals for OT and PT. Staff seemed to have a hard time pulling up patient info despite the fact that patient was seen by Dr. Gerardine Knock on 03/16/24. Staff said they would send correct referrals over

## 2024-05-16 ENCOUNTER — Other Ambulatory Visit: Payer: Self-pay | Admitting: Orthopedic Surgery

## 2024-05-24 ENCOUNTER — Encounter (HOSPITAL_COMMUNITY): Payer: Self-pay

## 2024-05-24 NOTE — Progress Notes (Signed)
 Surgical Instructions   Your procedure is scheduled on Wednesday May 31, 2024. Report to [facility] Main Entrance A at 10:00 A.M., then check in with the Admitting office. Any questions or running late day of surgery: call [phone]  Questions prior to your surgery date: call [phone], Monday-Friday, 8am-4pm. If you experience any cold or flu symptoms such as cough, fever, chills, shortness of breath, etc. between now and your scheduled surgery, please notify us  at the above number.     Remember:  Do not eat after midnight the night before your surgery  You may drink clear liquids until 10:00 the morning of your surgery.   Clear liquids allowed are: Water, Non-Citrus Juices (without pulp), Carbonated Beverages, Clear Tea (no milk, honey, etc.), Black Coffee Only (NO MILK, CREAM OR POWDERED CREAMER of any kind), and Gatorade.  Patient Instructions  The night before surgery:  No food after midnight. ONLY clear liquids after midnight  The day of surgery (if you have diabetes): Drink ONE (1) 12 oz G2 given to you in your pre admission testing appointment by 10:00 the morning of surgery. Drink in one sitting. Do not sip.  This drink was given to you during your hospital  pre-op appointment visit.  Nothing else to drink after completing the  12 oz bottle of G2.         If you have questions, please contact your surgeon's office.     Take these medicines the morning of surgery with A SIP OF WATER  busPIRone (BUSPAR)  carbidopa-levodopa (SINEMET IR)  finasteride (PROSCAR)  fluticasone (FLONASE)  loratadine (CLARITIN)  pantoprazole (PROTONIX)   May take these medicines IF NEEDED: acetaminophen (TYLENOL)  ALPRAZolam (XANAX)  famotidine (PEPCID)  omeprazole (PRILOSEC)   One week prior to surgery, STOP taking any Aspirin (unless otherwise instructed by your surgeon) Aleve, Naproxen, Ibuprofen, Motrin, Advil, Goody's, BC's, all herbal medications, fish oil, and  non-prescription vitamins. This includes your diclofenac (VOLTAREN).     Do not take oral diabetes medicines (pills) the morning of surgery.        DO NOT TAKE YOUR metFORMIN (GLUCOPHAGE) THE MORNING OF SURGERY.     The day of surgery, do not take other diabetes injectables, including Byetta (exenatide), Bydureon (exenatide ER), Victoza (liraglutide), or Trulicity (dulaglutide).  If your CBG is greater than 220 mg/dL, you may take  of your sliding scale (correction) dose of insulin.   HOW TO MANAGE YOUR DIABETES BEFORE AND AFTER SURGERY  Why is it important to control my blood sugar before and after surgery? Improving blood sugar levels before and after surgery helps healing and can limit problems. A way of improving blood sugar control is eating a healthy diet by:  Eating less sugar and carbohydrates  Increasing activity/exercise  Talking with your doctor about reaching your blood sugar goals High blood sugars (greater than 180 mg/dL) can raise your risk of infections and slow your recovery, so you will need to focus on controlling your diabetes during the weeks before surgery. Make sure that the doctor who takes care of your diabetes knows about your planned surgery including the date and location.  How do I manage my blood sugar before surgery? Check your blood sugar at least 4 times a day, starting 2 days before surgery, to make sure that the level is not too high or low.  Check your blood sugar the morning of your surgery when you wake up and every 2 hours until you get to the Short Stay  unit.  If your blood sugar is less than 70 mg/dL, you will need to treat for low blood sugar: Do not take insulin. Treat a low blood sugar (less than 70 mg/dL) with  cup of clear juice (cranberry or apple), 4 glucose tablets, OR glucose gel. Recheck blood sugar in 15 minutes after treatment (to make sure it is greater than 70 mg/dL). If your blood sugar is not greater than 70 mg/dL on recheck,  call [phone] for further instructions. Report your blood sugar to the short stay nurse when you get to Short Stay.  If you are admitted to the hospital after surgery: Your blood sugar will be checked by the staff and you will probably be given insulin after surgery (instead of oral diabetes medicines) to make sure you have good blood sugar levels. The goal for blood sugar control after surgery is 80-180 mg/dL.                      Do NOT Smoke (Tobacco/Vaping) for 24 hours prior to your procedure.  If you use a CPAP at night, you may bring your mask/headgear for your overnight stay.   You will be asked to remove any contacts, glasses, piercing's, hearing aid's, dentures/partials prior to surgery. Please bring cases for these items if needed.    Patients discharged the day of surgery will not be allowed to drive home, and someone needs to stay with them for 24 hours.  SURGICAL WAITING ROOM VISITATION Patients may have no more than 2 support people in the waiting area - these visitors may rotate.   Pre-op nurse will coordinate an appropriate time for 1 ADULT support person, who may not rotate, to accompany patient in pre-op.  Children under the age of 14 must have an adult with them who is not the patient and must remain in the main waiting area with an adult.  If the patient needs to stay at the hospital during part of their recovery, the visitor guidelines for inpatient rooms apply.  Please refer to the [facility] website for the visitor guidelines for any additional information.   If you received a COVID test during your pre-op visit  it is requested that you wear a mask when out in public, stay away from anyone that may not be feeling well and notify your surgeon if you develop symptoms. If you have been in contact with anyone that has tested positive in the last 10 days please notify you surgeon.      Pre-operative 5 CHG Bathing Instructions   You can play a key role in  reducing the risk of infection after surgery. Your skin needs to be as free of germs as possible. You can reduce the number of germs on your skin by washing with CHG (chlorhexidine gluconate) soap before surgery. CHG is an antiseptic soap that kills germs and continues to kill germs even after washing.   DO NOT use if you have an allergy to chlorhexidine/CHG or antibacterial soaps. If your skin becomes reddened or irritated, stop using the CHG and notify one of our RNs at [phone].   Please shower with the CHG soap starting 4 days before surgery using the following schedule:     Please keep in mind the following:  You may shave your face at any point before/day of surgery.  Place clean sheets on your bed the day you start using CHG soap. Use a clean washcloth (not used since being washed) for each  shower. DO NOT sleep with pets once you start using the CHG.   CHG Shower Instructions:  Wash your face and private area with normal soap. If you choose to wash your hair, wash first with your normal shampoo.  After you use shampoo/soap, rinse your hair and body thoroughly to remove shampoo/soap residue.  Turn the water OFF and apply about 3 tablespoons (45 ml) of CHG soap to a CLEAN washcloth.  Apply CHG soap ONLY FROM YOUR NECK DOWN TO YOUR TOES (washing for 3-5 minutes)  DO NOT use CHG soap on face, private areas, open wounds, or sores.  Pay special attention to the area where your surgery is being performed.  If you are having back surgery, having someone wash your back for you may be helpful. Wait 2 minutes after CHG soap is applied, then you may rinse off the CHG soap.  Pat dry with a clean towel  Put on clean clothes/pajamas   If you choose to wear lotion, please use ONLY the CHG-compatible lotions that are listed below.  Additional instructions for the day of surgery: DO NOT APPLY any lotions, deodorants or cologne.   Do not bring valuables to the hospital. [facility] is not  responsible for any belongings/valuables. Do not wear jewelry  Put on clean/comfortable clothes.  Please brush your teeth.  Ask your nurse before applying any prescription medications to the skin.     CHG Compatible Lotions   Aveeno Moisturizing lotion  Cetaphil Moisturizing Cream  Cetaphil Moisturizing Lotion  Clairol Herbal Essence Moisturizing Lotion, Dry Skin  Clairol Herbal Essence Moisturizing Lotion, Extra Dry Skin  Clairol Herbal Essence Moisturizing Lotion, Normal Skin  Curel Age Defying Therapeutic Moisturizing Lotion with Alpha Hydroxy  Curel Extreme Care Body Lotion  Curel Soothing Hands Moisturizing Hand Lotion  Curel Therapeutic Moisturizing Cream, Fragrance-Free  Curel Therapeutic Moisturizing Lotion, Fragrance-Free  Curel Therapeutic Moisturizing Lotion, Original Formula  Eucerin Daily Replenishing Lotion  Eucerin Dry Skin Therapy Plus Alpha Hydroxy Crme  Eucerin Dry Skin Therapy Plus Alpha Hydroxy Lotion  Eucerin Original Crme  Eucerin Original Lotion  Eucerin Plus Crme Eucerin Plus Lotion  Eucerin TriLipid Replenishing Lotion  Keri Anti-Bacterial Hand Lotion  Keri Deep Conditioning Original Lotion Dry Skin Formula Softly Scented  Keri Deep Conditioning Original Lotion, Fragrance Free Sensitive Skin Formula  Keri Lotion Fast Absorbing Fragrance Free Sensitive Skin Formula  Keri Lotion Fast Absorbing Softly Scented Dry Skin Formula  Keri Original Lotion  Keri Skin Renewal Lotion Keri Silky Smooth Lotion  Keri Silky Smooth Sensitive Skin Lotion  Nivea Body Creamy Conditioning Oil  Nivea Body Extra Enriched Lotion  Nivea Body Original Lotion  Nivea Body Sheer Moisturizing Lotion Nivea Crme  Nivea Skin Firming Lotion  NutraDerm 30 Skin Lotion  NutraDerm Skin Lotion  NutraDerm Therapeutic Skin Cream  NutraDerm Therapeutic Skin Lotion  ProShield Protective Hand Cream  Provon moisturizing lotion  Please read over the following fact sheets that you were  given.

## 2024-05-25 ENCOUNTER — Other Ambulatory Visit: Payer: Self-pay

## 2024-05-25 ENCOUNTER — Encounter (HOSPITAL_COMMUNITY)
Admission: RE | Admit: 2024-05-25 | Discharge: 2024-05-25 | Disposition: A | Discharge status: Home / Self Care | Source: Ambulatory Visit | Attending: Orthopedic Surgery | Admitting: Orthopedic Surgery

## 2024-05-25 ENCOUNTER — Encounter (HOSPITAL_COMMUNITY): Payer: Self-pay

## 2024-05-25 VITALS — BP 138/87 | HR 73 | Temp 98.2°F | Resp 18 | Ht 70.0 in | Wt 176.9 lb

## 2024-05-25 DIAGNOSIS — Z01818 Encounter for other preprocedural examination: Secondary | ICD-10-CM | POA: Insufficient documentation

## 2024-05-25 DIAGNOSIS — F419 Anxiety disorder, unspecified: Secondary | ICD-10-CM | POA: Insufficient documentation

## 2024-05-25 DIAGNOSIS — G20A1 Parkinson's disease without dyskinesia, without mention of fluctuations: Secondary | ICD-10-CM | POA: Diagnosis not present

## 2024-05-25 DIAGNOSIS — M5127 Other intervertebral disc displacement, lumbosacral region: Secondary | ICD-10-CM | POA: Diagnosis not present

## 2024-05-25 DIAGNOSIS — E119 Type 2 diabetes mellitus without complications: Secondary | ICD-10-CM | POA: Diagnosis not present

## 2024-05-25 DIAGNOSIS — M5126 Other intervertebral disc displacement, lumbar region: Secondary | ICD-10-CM | POA: Diagnosis not present

## 2024-05-25 DIAGNOSIS — K219 Gastro-esophageal reflux disease without esophagitis: Secondary | ICD-10-CM | POA: Diagnosis not present

## 2024-05-25 HISTORY — DX: Gastro-esophageal reflux disease without esophagitis: K21.9

## 2024-05-25 HISTORY — DX: Parkinson's disease without dyskinesia, without mention of fluctuations: G20.A1

## 2024-05-25 HISTORY — DX: Anxiety disorder, unspecified: F41.9

## 2024-05-25 LAB — GLUCOSE, CAPILLARY: Glucose-Capillary: 150 mg/dL — ABNORMAL HIGH (ref 70–99)

## 2024-05-25 LAB — CBC
HCT: 41 % (ref 39.0–52.0)
Hemoglobin: 14.1 g/dL (ref 13.0–17.0)
MCH: 31.9 pg (ref 26.0–34.0)
MCHC: 34.4 g/dL (ref 30.0–36.0)
MCV: 92.8 fL (ref 80.0–100.0)
Platelets: 207 K/uL (ref 150–400)
RBC: 4.42 MIL/uL (ref 4.22–5.81)
RDW: 12.2 % (ref 11.5–15.5)
WBC: 7.6 K/uL (ref 4.0–10.5)
nRBC: 0 % (ref 0.0–0.2)

## 2024-05-25 LAB — BASIC METABOLIC PANEL WITH GFR
Anion gap: 9 (ref 5–15)
BUN: 16 mg/dL (ref 8–23)
CO2: 27 mmol/L (ref 22–32)
Calcium: 9.4 mg/dL (ref 8.9–10.3)
Chloride: 97 mmol/L — ABNORMAL LOW (ref 98–111)
Creatinine, Ser: 0.76 mg/dL (ref 0.61–1.24)
GFR, Estimated: >60 mL/min (ref >60)
Glucose, Bld: 150 mg/dL — ABNORMAL HIGH (ref 70–99)
Potassium: 4.3 mmol/L (ref 3.5–5.1)
Sodium: 133 mmol/L — ABNORMAL LOW (ref 135–145)

## 2024-05-25 LAB — SURGICAL PCR SCREEN
MRSA, PCR: NEGATIVE
Staphylococcus aureus: NEGATIVE

## 2024-05-25 NOTE — Progress Notes (Signed)
 PCP - Dr. Ardell Manly Cardiologist - denies Neurologist- Brad Boards, MD   PPM/ICD - denies   Chest x-ray - 08/02/23 EKG - 05/25/24 Stress Test - denies ECHO - denies Cardiac Cath - denies  Sleep Study - denies   Fasting Blood Sugar - 100-120 Checks Blood Sugar once/week  Last dose of GLP1 agonist-  n/a   ASA/Blood Thinner Instructions: n/a   ERAS Protcol - clears until 1000 PRE-SURGERY G2- given  COVID TEST- n/a   Anesthesia review: yes, parkinson's  Patient denies shortness of breath, fever, cough and chest pain at PAT appointment   All instructions explained to the patient, with a verbal understanding of the material. Patient agrees to go over the instructions while at home for a better understanding.  The opportunity to ask questions was provided.

## 2024-05-25 NOTE — Progress Notes (Signed)
 Surgical Instructions   Your procedure is scheduled on Wednesday May 31, 2024. Report to [facility] Main Entrance A at 10:00 A.M., then check in with the Admitting office. Any questions or running late day of surgery: call [phone]  Questions prior to your surgery date: call [phone], Monday-Friday, 8am-4pm. If you experience any cold or flu symptoms such as cough, fever, chills, shortness of breath, etc. between now and your scheduled surgery, please notify us  at the above number.     Remember:  Do not eat after midnight the night before your surgery  You may drink clear liquids until 10:00 the morning of your surgery.   Clear liquids allowed are: Water, Non-Citrus Juices (without pulp), Carbonated Beverages, Clear Tea (no milk, honey, etc.), Black Coffee Only (NO MILK, CREAM OR POWDERED CREAMER of any kind), and Gatorade.  Patient Instructions  The night before surgery:  No food after midnight. ONLY clear liquids after midnight  The day of surgery (if you have diabetes): Drink ONE (1) 12 oz G2 given to you in your pre admission testing appointment by 10:00 the morning of surgery. Drink in one sitting. Do not sip.  This drink was given to you during your hospital  pre-op appointment visit.  Nothing else to drink after completing the  12 oz bottle of G2.         If you have questions, please contact your surgeon's office.     Take these medicines the morning of surgery with A SIP OF WATER  busPIRone (BUSPAR)  carbidopa-levodopa (SINEMET IR)  finasteride (PROSCAR)  loratadine (CLARITIN)  JOURNAVX  loratadine (CLARITIN)  omeprazole (PRILOSEC)  rivastigmine (EXELON)   May take these medicines IF NEEDED: acetaminophen (TYLENOL)  famotidine (PEPCID)  fluticasone (FLONASE)  HYDROcodone-acetaminophen (NORCO/VICODIN)    One week prior to surgery, STOP taking any Aspirin (unless otherwise instructed by your surgeon) Aleve, Naproxen, Ibuprofen, Motrin, Advil,  Goody's, BC's, all herbal medications, fish oil, and non-prescription vitamins. This includes your diclofenac (VOLTAREN) and celecoxib (CELEBREX).    WHAT DO I DO ABOUT MY DIABETES MEDICATION? Do not take metFORMIN (GLUCOPHAGE) the morning of surgery.    HOW TO MANAGE YOUR DIABETES BEFORE AND AFTER SURGERY  Why is it important to control my blood sugar before and after surgery? Improving blood sugar levels before and after surgery helps healing and can limit problems. A way of improving blood sugar control is eating a healthy diet by:  Eating less sugar and carbohydrates  Increasing activity/exercise  Talking with your doctor about reaching your blood sugar goals High blood sugars (greater than 180 mg/dL) can raise your risk of infections and slow your recovery, so you will need to focus on controlling your diabetes during the weeks before surgery. Make sure that the doctor who takes care of your diabetes knows about your planned surgery including the date and location.  How do I manage my blood sugar before surgery? Check your blood sugar at least 4 times a day, starting 2 days before surgery, to make sure that the level is not too high or low.  Check your blood sugar the morning of your surgery when you wake up and every 2 hours until you get to the Short Stay unit.  If your blood sugar is less than 70 mg/dL, you will need to treat for low blood sugar: Do not take insulin. Treat a low blood sugar (less than 70 mg/dL) with  cup of clear juice (cranberry or apple), 4 glucose tablets, OR glucose gel.  Recheck blood sugar in 15 minutes after treatment (to make sure it is greater than 70 mg/dL). If your blood sugar is not greater than 70 mg/dL on recheck, call [phone] for further instructions. Report your blood sugar to the short stay nurse when you get to Short Stay.  If you are admitted to the hospital after surgery: Your blood sugar will be checked by the staff and you will  probably be given insulin after surgery (instead of oral diabetes medicines) to make sure you have good blood sugar levels. The goal for blood sugar control after surgery is 80-180 mg/dL.                      Do NOT Smoke (Tobacco/Vaping) for 24 hours prior to your procedure.  If you use a CPAP at night, you may bring your mask/headgear for your overnight stay.   You will be asked to remove any contacts, glasses, piercing's, hearing aid's, dentures/partials prior to surgery. Please bring cases for these items if needed.    Patients discharged the day of surgery will not be allowed to drive home, and someone needs to stay with them for 24 hours.  SURGICAL WAITING ROOM VISITATION Patients may have no more than 2 support people in the waiting area - these visitors may rotate.   Pre-op nurse will coordinate an appropriate time for 1 ADULT support person, who may not rotate, to accompany patient in pre-op.  Children under the age of 17 must have an adult with them who is not the patient and must remain in the main waiting area with an adult.  If the patient needs to stay at the hospital during part of their recovery, the visitor guidelines for inpatient rooms apply.  Please refer to the [facility] website for the visitor guidelines for any additional information.   If you received a COVID test during your pre-op visit  it is requested that you wear a mask when out in public, stay away from anyone that may not be feeling well and notify your surgeon if you develop symptoms. If you have been in contact with anyone that has tested positive in the last 10 days please notify you surgeon.      Pre-operative 5 CHG Bathing Instructions   You can play a key role in reducing the risk of infection after surgery. Your skin needs to be as free of germs as possible. You can reduce the number of germs on your skin by washing with CHG (chlorhexidine gluconate) soap before surgery. CHG is an antiseptic soap  that kills germs and continues to kill germs even after washing.   DO NOT use if you have an allergy to chlorhexidine/CHG or antibacterial soaps. If your skin becomes reddened or irritated, stop using the CHG and notify one of our RNs at [phone].   Please shower with the CHG soap starting 4 days before surgery using the following schedule:     Please keep in mind the following:  You may shave your face at any point before/day of surgery.  Place clean sheets on your bed the day you start using CHG soap. Use a clean washcloth (not used since being washed) for each shower. DO NOT sleep with pets once you start using the CHG.   CHG Shower Instructions:  Wash your face and private area with normal soap. If you choose to wash your hair, wash first with your normal shampoo.  After you use shampoo/soap, rinse your hair  and body thoroughly to remove shampoo/soap residue.  Turn the water OFF and apply about 3 tablespoons (45 ml) of CHG soap to a CLEAN washcloth.  Apply CHG soap ONLY FROM YOUR NECK DOWN TO YOUR TOES (washing for 3-5 minutes)  DO NOT use CHG soap on face, private areas, open wounds, or sores.  Pay special attention to the area where your surgery is being performed.  If you are having back surgery, having someone wash your back for you may be helpful. Wait 2 minutes after CHG soap is applied, then you may rinse off the CHG soap.  Pat dry with a clean towel  Put on clean clothes/pajamas   If you choose to wear lotion, please use ONLY the CHG-compatible lotions that are listed below.  Additional instructions for the day of surgery: DO NOT APPLY any lotions, deodorants or cologne.   Do not bring valuables to the hospital. [facility] is not responsible for any belongings/valuables. Do not wear jewelry  Put on clean/comfortable clothes.  Please brush your teeth.  Ask your nurse before applying any prescription medications to the skin.     CHG Compatible Lotions   Aveeno  Moisturizing lotion  Cetaphil Moisturizing Cream  Cetaphil Moisturizing Lotion  Clairol Herbal Essence Moisturizing Lotion, Dry Skin  Clairol Herbal Essence Moisturizing Lotion, Extra Dry Skin  Clairol Herbal Essence Moisturizing Lotion, Normal Skin  Curel Age Defying Therapeutic Moisturizing Lotion with Alpha Hydroxy  Curel Extreme Care Body Lotion  Curel Soothing Hands Moisturizing Hand Lotion  Curel Therapeutic Moisturizing Cream, Fragrance-Free  Curel Therapeutic Moisturizing Lotion, Fragrance-Free  Curel Therapeutic Moisturizing Lotion, Original Formula  Eucerin Daily Replenishing Lotion  Eucerin Dry Skin Therapy Plus Alpha Hydroxy Crme  Eucerin Dry Skin Therapy Plus Alpha Hydroxy Lotion  Eucerin Original Crme  Eucerin Original Lotion  Eucerin Plus Crme Eucerin Plus Lotion  Eucerin TriLipid Replenishing Lotion  Keri Anti-Bacterial Hand Lotion  Keri Deep Conditioning Original Lotion Dry Skin Formula Softly Scented  Keri Deep Conditioning Original Lotion, Fragrance Free Sensitive Skin Formula  Keri Lotion Fast Absorbing Fragrance Free Sensitive Skin Formula  Keri Lotion Fast Absorbing Softly Scented Dry Skin Formula  Keri Original Lotion  Keri Skin Renewal Lotion Keri Silky Smooth Lotion  Keri Silky Smooth Sensitive Skin Lotion  Nivea Body Creamy Conditioning Oil  Nivea Body Extra Enriched Lotion  Nivea Body Original Lotion  Nivea Body Sheer Moisturizing Lotion Nivea Crme  Nivea Skin Firming Lotion  NutraDerm 30 Skin Lotion  NutraDerm Skin Lotion  NutraDerm Therapeutic Skin Cream  NutraDerm Therapeutic Skin Lotion  ProShield Protective Hand Cream  Provon moisturizing lotion  Please read over the following fact sheets that you were given.

## 2024-05-26 NOTE — Progress Notes (Signed)
 Anesthesia Chart Review:  Case: [id number] Date/Time: 05/31/24 1245   Procedure: DECOMPRESSIVE LUMBAR LAMINECTOMY LEVEL 2 - LUMBAR 4- LUMBAR 5, LUMBAR 5 - SACRUM 1 DECOMPRESSION   Anesthesia type: General   Pre-op diagnosis: DISC HERNIATION   Location: [facility] ROOM 05 / [facility] OR   Surgeons: Beuford Anes, MD       DISCUSSION: Patient is a 63 year old male scheduled for the above procedure.  History includes never smoker, DM2, GERD, Parkinson disease (diagnosed in 2023), anxiety.  Last visit with neurologist Dr. Marci was on 03/16/24. Parkinsons with good response to carbidopa/levodopa. Biggest current issue for him for low back pain and under evaluation. She wrote, I informed the patient that there are no contraindications for surgical interventions (should one needed) with the prior diagnosis of Parkinson' s disease.  Gabapentin 300 mg Q HS recommended to help with nerve pain and sleep.  For cognition he remained Rivastigmine 3 mg twice daily.  Anesthesia team to evaluate on the day of surgery.   VS: BP 138/87   Pulse 73   Temp 36.8 C   Resp 18   Ht 5' 10 (1.778 m)   Wt 80.2 kg   SpO2 99%   BMI 25.38 kg/m   PROVIDERS: [name], MD is PCP  Marci Cambric, MD is neurologist [facility])   LABS: Labs reviewed: Acceptable for surgery. A1c 7.0 on 04/06/24 (CE).  (all labs ordered are listed, but only abnormal results are displayed)  Labs Reviewed  GLUCOSE, CAPILLARY - Abnormal; Notable for the following components:      Result Value   Glucose-Capillary 150 (*)    All other components within normal limits  BASIC METABOLIC PANEL WITH GFR - Abnormal; Notable for the following components:   Sodium 133 (*)    Chloride 97 (*)    Glucose, Bld 150 (*)    All other components within normal limits  SURGICAL PCR SCREEN  CBC     IMAGES: MRI L-spine 02/18/24: IMPRESSION: 1. Spondylosis at the lumbar and visualized lower thoracic levels, as outlined within the body of the report. 2.  At L5-S1, a broad-based disc protrusion contributes to moderate right subarticular stenosis with crowding of the descending right S1 nerve root. It also results in mild left subarticular narrowing and mild narrowing of the central canal. Multifactorial bilateral neural foraminal narrowing at this level (severe right, moderate left). 3. At L4-L5, a small right subarticular disc protrusion contributes to right subarticular narrowing, with mild posterior displacement of the descending right L5 nerve root. Mild central canal and bilateral neural foraminal stenosis also present at this level. 4. At L3-L4, there is multifactorial moderate central canal stenosis with bilateral subarticular narrowing. Bilateral neural foraminal narrowing (mild-to-moderate right, mild left). 5. At L2-L3, there is multifactorial moderate-to-severe central canal stenosis with bilateral subarticular narrowing. Mild right neural foraminal narrowing. 6. No more than mild degenerative spinal canal narrowing at the remaining levels. 7. Disc degeneration is greatest at L4-L5 (moderate to moderately advanced at this level). 8. Minimal degenerative endplate edema at O4-D8.   CXR 08/02/23: FINDINGS: The heart size and mediastinal contours are within normal limits. Both lungs are clear. The visualized skeletal structures are stable. IMPRESSION: No active cardiopulmonary disease.   EKG: 05/25/24: Normal sinus rhythm with sinus arrhythmia Normal ECG No previous ECGs available Confirmed by Raford Riggs ([id number]) on 05/25/2024 3:09:04 PM  CV: N/A  Past Medical History:  Diagnosis Date   Anxiety    Diabetes (HCC)    GERD (gastroesophageal reflux  disease)    Parkinson's disease (HCC)     Past Surgical History:  Procedure Laterality Date   COLONOSCOPY     LASIK Bilateral    WISDOM TOOTH EXTRACTION      MEDICATIONS:  acetaminophen  (TYLENOL ) 325 MG tablet   ALPRAZolam (XANAX) 0.5 MG tablet   b complex  vitamins capsule   busPIRone (BUSPAR) 5 MG tablet   carbidopa-levodopa (SINEMET IR) 25-100 MG tablet   celecoxib (CELEBREX) 100 MG capsule   diclofenac (VOLTAREN) 50 MG EC tablet   diphenoxylate-atropine (LOMOTIL) 2.5-0.025 MG tablet   eszopiclone (LUNESTA) 2 MG TABS tablet   famotidine (PEPCID) 10 MG tablet   finasteride (PROSCAR) 5 MG tablet   fluticasone (FLONASE) 50 MCG/ACT nasal spray   gabapentin (NEURONTIN) 300 MG capsule   HYDROcodone -acetaminophen  (NORCO/VICODIN) 5-325 MG tablet   ibuprofen (ADVIL) 200 MG tablet   JOURNAVX 50 MG TABS   Krill Oil 500 MG CAPS   loratadine (CLARITIN) 10 MG tablet   Magnesium 250 MG TABS   metFORMIN (GLUCOPHAGE) 850 MG tablet   Multiple Vitamin (MULTIVITAMIN) tablet   omeprazole (PRILOSEC) 40 MG capsule   pantoprazole (PROTONIX) 40 MG tablet   rivastigmine (EXELON) 3 MG capsule   sildenafil (VIAGRA) 100 MG tablet   vitamin C (ASCORBIC ACID) 500 MG tablet   No current facility-administered medications for this encounter.    Isaiah Ruder, PA-C Surgical Short Stay/Anesthesiology [facility] Phone [phone] [facility] Phone [phone] 05/26/2024 9:04 PM

## 2024-05-26 NOTE — Anesthesia Preprocedure Evaluation (Signed)
 Anesthesia Evaluation  Patient identified by MRN, date of birth, ID band Patient awake    Reviewed: Allergy & Precautions, NPO status , Patient's Chart, lab work & pertinent test results  History of Anesthesia Complications Negative for: history of anesthetic complications  Airway Mallampati: III  TM Distance: >3 FB Neck ROM: Full    Dental  (+) Teeth Intact, Dental Advisory Given   Pulmonary neg shortness of breath, neg sleep apnea, neg COPD, neg recent URI   breath sounds clear to auscultation       Cardiovascular (-) hypertension(-) angina (-) Past MI and (-) CHF negative cardio ROS  Rhythm:Irregular     Neuro/Psych  PSYCHIATRIC DISORDERS Anxiety      Neuromuscular disease    GI/Hepatic Neg liver ROS,GERD  Medicated and Controlled,,  Endo/Other  diabetes, Type 2    Renal/GU Lab Results      Component                Value               Date                      NA                       133 (L)             05/25/2024                K                        4.3                 05/25/2024                CO2                      27                  05/25/2024                GLUCOSE                  150 (H)             05/25/2024                BUN                      16                  05/25/2024                CREATININE               0.76                05/25/2024                CALCIUM                  9.4                 05/25/2024                GFRNONAA                 >60  05/25/2024                Musculoskeletal   Abdominal   Peds  Hematology negative hematology ROS (+) Lab Results      Component                Value               Date                      WBC                      7.6                 05/25/2024                HGB                      14.1                05/25/2024                HCT                      41.0                05/25/2024                MCV                       92.8                05/25/2024                PLT                      207                 05/25/2024              Anesthesia Other Findings   Reproductive/Obstetrics                              Anesthesia Physical Anesthesia Plan  ASA: 2  Anesthesia Plan: General   Post-op Pain Management: Ofirmev  IV (intra-op)*   Induction: Intravenous  PONV Risk Score and Plan: 3 and Ondansetron , Dexamethasone , Propofol  infusion and TIVA  Airway Management Planned: Oral ETT  Additional Equipment: None  Intra-op Plan:   Post-operative Plan: Extubation in OR  Informed Consent: I have reviewed the patients History and Physical, chart, labs and discussed the procedure including the risks, benefits and alternatives for the proposed anesthesia with the patient or authorized representative who has indicated his/her understanding and acceptance.     Dental advisory given  Plan Discussed with: CRNA  Anesthesia Plan Comments: (PAT note written 05/26/2024 by Rakel Junio, PA-C.  )         Anesthesia Quick Evaluation

## 2024-05-29 ENCOUNTER — Ambulatory Visit: Attending: Internal Medicine | Admitting: Occupational Therapy

## 2024-05-29 ENCOUNTER — Ambulatory Visit: Admitting: Physical Therapy

## 2024-05-31 ENCOUNTER — Ambulatory Visit (HOSPITAL_COMMUNITY)
Admission: RE | Admit: 2024-05-31 | Discharge: 2024-05-31 | Disposition: A | Discharge status: Home / Self Care | Attending: Orthopedic Surgery | Admitting: Orthopedic Surgery

## 2024-05-31 ENCOUNTER — Other Ambulatory Visit: Payer: Self-pay

## 2024-05-31 ENCOUNTER — Ambulatory Visit (HOSPITAL_COMMUNITY): Payer: Self-pay | Admitting: Physician Assistant

## 2024-05-31 ENCOUNTER — Encounter (HOSPITAL_COMMUNITY): Payer: Self-pay | Admitting: Orthopedic Surgery

## 2024-05-31 ENCOUNTER — Ambulatory Visit (HOSPITAL_COMMUNITY)

## 2024-05-31 ENCOUNTER — Encounter (HOSPITAL_COMMUNITY)
Admission: RE | Disposition: A | Discharge status: Home / Self Care | Payer: Self-pay | Source: Home / Self Care | Attending: Orthopedic Surgery

## 2024-05-31 ENCOUNTER — Ambulatory Visit (HOSPITAL_COMMUNITY): Admitting: Certified Registered Nurse Anesthetist

## 2024-05-31 DIAGNOSIS — Z79899 Other long term (current) drug therapy: Secondary | ICD-10-CM | POA: Insufficient documentation

## 2024-05-31 DIAGNOSIS — F419 Anxiety disorder, unspecified: Secondary | ICD-10-CM | POA: Diagnosis not present

## 2024-05-31 DIAGNOSIS — M48061 Spinal stenosis, lumbar region without neurogenic claudication: Secondary | ICD-10-CM | POA: Diagnosis not present

## 2024-05-31 DIAGNOSIS — M5126 Other intervertebral disc displacement, lumbar region: Secondary | ICD-10-CM

## 2024-05-31 DIAGNOSIS — G709 Myoneural disorder, unspecified: Secondary | ICD-10-CM | POA: Insufficient documentation

## 2024-05-31 DIAGNOSIS — M5416 Radiculopathy, lumbar region: Secondary | ICD-10-CM | POA: Diagnosis present

## 2024-05-31 DIAGNOSIS — E119 Type 2 diabetes mellitus without complications: Secondary | ICD-10-CM | POA: Insufficient documentation

## 2024-05-31 DIAGNOSIS — M4807 Spinal stenosis, lumbosacral region: Secondary | ICD-10-CM | POA: Diagnosis not present

## 2024-05-31 DIAGNOSIS — Z7984 Long term (current) use of oral hypoglycemic drugs: Secondary | ICD-10-CM | POA: Insufficient documentation

## 2024-05-31 DIAGNOSIS — Z833 Family history of diabetes mellitus: Secondary | ICD-10-CM | POA: Insufficient documentation

## 2024-05-31 DIAGNOSIS — K219 Gastro-esophageal reflux disease without esophagitis: Secondary | ICD-10-CM | POA: Insufficient documentation

## 2024-05-31 HISTORY — PX: DECOMPRESSIVE LUMBAR LAMINECTOMY LEVEL 2: SHX5792

## 2024-05-31 LAB — GLUCOSE, CAPILLARY
Glucose-Capillary: 181 mg/dL — ABNORMAL HIGH (ref 70–99)
Glucose-Capillary: 170 mg/dL — ABNORMAL HIGH (ref 70–99)
Glucose-Capillary: 160 mg/dL — ABNORMAL HIGH (ref 70–99)
Glucose-Capillary: 164 mg/dL — ABNORMAL HIGH (ref 70–99)

## 2024-05-31 SURGERY — DECOMPRESSIVE LUMBAR LAMINECTOMY LEVEL 2
Anesthesia: General | Site: Spine Lumbar

## 2024-05-31 MED ORDER — BUPIVACAINE-EPINEPHRINE 0.25% -1:200000 IJ SOLN
INTRAMUSCULAR | Status: DC | PRN
Start: 2024-05-31 — End: 2024-05-31
  Administered 2024-05-31: 30 mL

## 2024-05-31 MED ORDER — ROCURONIUM BROMIDE 10 MG/ML (PF) SYRINGE
PREFILLED_SYRINGE | INTRAVENOUS | Status: AC
Start: 2024-05-31 — End: 2024-05-31
  Filled 2024-05-31: qty 10

## 2024-05-31 MED ORDER — EPHEDRINE 5 MG/ML INJ
INTRAVENOUS | Status: AC
Start: 2024-05-31 — End: 2024-05-31
  Filled 2024-05-31: qty 5

## 2024-05-31 MED ORDER — BUPIVACAINE LIPOSOME 1.3 % IJ SUSP
INTRAMUSCULAR | Status: DC | PRN
  Administered 2024-05-31: 20 mL

## 2024-05-31 MED ORDER — MIDAZOLAM HCL 2 MG/2ML IJ SOLN
INTRAMUSCULAR | Status: DC | PRN
  Administered 2024-05-31 (×2): 1 mg via INTRAVENOUS

## 2024-05-31 MED ORDER — HYDROCODONE-ACETAMINOPHEN 5-325 MG PO TABS: 1.0000 | ORAL_TABLET | Freq: Four times a day (QID) | ORAL | 0 refills | Status: AC | PRN

## 2024-05-31 MED ORDER — PHENYLEPHRINE HCL-NACL 20-0.9 MG/250ML-% IV SOLN
INTRAVENOUS | Status: DC | PRN
Start: 2024-05-31 — End: 2024-05-31
  Administered 2024-05-31: 15 ug/min via INTRAVENOUS

## 2024-05-31 MED ORDER — CEFAZOLIN SODIUM-DEXTROSE 2-4 GM/100ML-% IV SOLN
2.0000 g | INTRAVENOUS | Status: AC
  Administered 2024-05-31: 2 g via INTRAVENOUS
  Filled 2024-05-31: qty 100

## 2024-05-31 MED ORDER — LIDOCAINE 2% (20 MG/ML) 5 ML SYRINGE
INTRAMUSCULAR | Status: DC | PRN
  Administered 2024-05-31: 60 mg via INTRAVENOUS

## 2024-05-31 MED ORDER — OXYCODONE HCL 5 MG PO TABS: 5.0000 mg | ORAL_TABLET | Freq: Once | ORAL | Status: DC | PRN

## 2024-05-31 MED ORDER — ONDANSETRON HCL 4 MG/2ML IJ SOLN
INTRAMUSCULAR | Status: AC
  Filled 2024-05-31: qty 2

## 2024-05-31 MED ORDER — PROPOFOL 500 MG/50ML IV EMUL
INTRAVENOUS | Status: DC | PRN
  Administered 2024-05-31: 100 ug/kg/min via INTRAVENOUS
  Administered 2024-05-31: 125 ug/kg/min via INTRAVENOUS

## 2024-05-31 MED ORDER — INSULIN ASPART 100 UNIT/ML IJ SOLN
0.0000 [IU] | INTRAMUSCULAR | Status: DC | PRN
  Administered 2024-05-31: 2 [IU] via SUBCUTANEOUS
  Filled 2024-05-31: qty 1

## 2024-05-31 MED ORDER — FENTANYL CITRATE (PF) 250 MCG/5ML IJ SOLN
INTRAMUSCULAR | Status: DC | PRN
  Administered 2024-05-31 (×3): 50 ug via INTRAVENOUS

## 2024-05-31 MED ORDER — ONDANSETRON HCL 4 MG/2ML IJ SOLN
INTRAMUSCULAR | Status: DC | PRN
  Administered 2024-05-31: 4 mg via INTRAVENOUS

## 2024-05-31 MED ORDER — DEXAMETHASONE SODIUM PHOSPHATE 10 MG/ML IJ SOLN
INTRAMUSCULAR | Status: AC
  Filled 2024-05-31: qty 1

## 2024-05-31 MED ORDER — FENTANYL CITRATE (PF) 100 MCG/2ML IJ SOLN: 25.0000 ug | INTRAMUSCULAR | Status: DC | PRN

## 2024-05-31 MED ORDER — STERILE WATER FOR IRRIGATION IR SOLN
Status: DC | PRN
  Administered 2024-05-31: 1000 mL

## 2024-05-31 MED ORDER — CHLORHEXIDINE GLUCONATE 0.12 % MT SOLN
15.0000 mL | Freq: Once | OROMUCOSAL | Status: AC
  Administered 2024-05-31: 15 mL via OROMUCOSAL
  Filled 2024-05-31: qty 15

## 2024-05-31 MED ORDER — FENTANYL CITRATE (PF) 250 MCG/5ML IJ SOLN
INTRAMUSCULAR | Status: AC
  Filled 2024-05-31: qty 5

## 2024-05-31 MED ORDER — PROPOFOL 1000 MG/100ML IV EMUL
INTRAVENOUS | Status: AC
  Filled 2024-05-31: qty 100

## 2024-05-31 MED ORDER — SUGAMMADEX SODIUM 200 MG/2ML IV SOLN
INTRAVENOUS | Status: AC
Start: 2024-05-31 — End: 2024-05-31
  Filled 2024-05-31: qty 2

## 2024-05-31 MED ORDER — SUGAMMADEX SODIUM 200 MG/2ML IV SOLN
INTRAVENOUS | Status: DC | PRN
  Administered 2024-05-31 (×2): 100 mg via INTRAVENOUS

## 2024-05-31 MED ORDER — MIDAZOLAM HCL 2 MG/2ML IJ SOLN
INTRAMUSCULAR | Status: AC
  Filled 2024-05-31: qty 2

## 2024-05-31 MED ORDER — METHOCARBAMOL 500 MG PO TABS: 500.0000 mg | ORAL_TABLET | Freq: Four times a day (QID) | ORAL | 2 refills | Status: AC

## 2024-05-31 MED ORDER — ORAL CARE MOUTH RINSE
15.0000 mL | Freq: Once | OROMUCOSAL | Status: AC
Start: 2024-05-31 — End: 2024-05-31

## 2024-05-31 MED ORDER — LACTATED RINGERS IV SOLN: INTRAVENOUS | Status: DC

## 2024-05-31 MED ORDER — METHYLPREDNISOLONE ACETATE 40 MG/ML IJ SUSP
INTRAMUSCULAR | Status: AC
  Filled 2024-05-31: qty 1

## 2024-05-31 MED ORDER — 0.9 % SODIUM CHLORIDE (POUR BTL) OPTIME
TOPICAL | Status: DC | PRN
  Administered 2024-05-31: 1000 mL

## 2024-05-31 MED ORDER — LIDOCAINE 2% (20 MG/ML) 5 ML SYRINGE
INTRAMUSCULAR | Status: AC
  Filled 2024-05-31: qty 5

## 2024-05-31 MED ORDER — PROPOFOL 1000 MG/100ML IV EMUL
INTRAVENOUS | Status: AC
  Filled 2024-05-31: qty 200

## 2024-05-31 MED ORDER — DEXAMETHASONE SODIUM PHOSPHATE 10 MG/ML IJ SOLN
INTRAMUSCULAR | Status: DC | PRN
  Administered 2024-05-31: 5 mg via INTRAVENOUS

## 2024-05-31 MED ORDER — ACETAMINOPHEN 10 MG/ML IV SOLN: 1000.0000 mg | Freq: Once | INTRAVENOUS | Status: DC | PRN

## 2024-05-31 MED ORDER — BUPIVACAINE-EPINEPHRINE (PF) 0.25% -1:200000 IJ SOLN
INTRAMUSCULAR | Status: AC
  Filled 2024-05-31: qty 30

## 2024-05-31 MED ORDER — ROCURONIUM BROMIDE 10 MG/ML (PF) SYRINGE
PREFILLED_SYRINGE | INTRAVENOUS | Status: DC | PRN
  Administered 2024-05-31: 10 mg via INTRAVENOUS
  Administered 2024-05-31: 70 mg via INTRAVENOUS

## 2024-05-31 MED ORDER — THROMBIN 20000 UNITS EX SOLR
CUTANEOUS | Status: AC
  Filled 2024-05-31: qty 20000

## 2024-05-31 MED ORDER — PROPOFOL 10 MG/ML IV BOLUS
INTRAVENOUS | Status: DC | PRN
  Administered 2024-05-31: 110 mg via INTRAVENOUS
  Administered 2024-05-31: 30 mg via INTRAVENOUS

## 2024-05-31 MED ORDER — PHENYLEPHRINE 80 MCG/ML (10ML) SYRINGE FOR IV PUSH (FOR BLOOD PRESSURE SUPPORT)
PREFILLED_SYRINGE | INTRAVENOUS | Status: AC
  Filled 2024-05-31: qty 10

## 2024-05-31 MED ORDER — OXYCODONE HCL 5 MG/5ML PO SOLN: 5.0000 mg | Freq: Once | ORAL | Status: DC | PRN

## 2024-05-31 MED ORDER — BUPIVACAINE LIPOSOME 1.3 % IJ SUSP
INTRAMUSCULAR | Status: AC
  Filled 2024-05-31: qty 20

## 2024-05-31 MED ORDER — POVIDONE-IODINE 7.5 % EX SOLN
Freq: Once | CUTANEOUS | Status: DC
  Filled 2024-05-31: qty 118

## 2024-05-31 MED ORDER — ALBUMIN HUMAN 5 % IV SOLN
INTRAVENOUS | Status: DC | PRN
Start: 2024-05-31 — End: 2024-05-31

## 2024-05-31 MED ORDER — SUGAMMADEX SODIUM 200 MG/2ML IV SOLN
INTRAVENOUS | Status: AC
  Filled 2024-05-31: qty 2

## 2024-05-31 MED ORDER — METHYLPREDNISOLONE ACETATE 40 MG/ML IJ SUSP
INTRAMUSCULAR | Status: DC | PRN
  Administered 2024-05-31: 40 mg

## 2024-05-31 MED ORDER — THROMBIN 20000 UNITS EX SOLR: CUTANEOUS | Status: DC | PRN

## 2024-05-31 SURGICAL SUPPLY — 62 items
BAG COUNTER SPONGE SURGICOUNT (BAG) ×1 IMPLANT
BENZOIN TINCTURE PRP APPL 2/3 (GAUZE/BANDAGES/DRESSINGS) IMPLANT
BNDG GAUZE DERMACEA FLUFF 4 (GAUZE/BANDAGES/DRESSINGS) ×1 IMPLANT
BUR ROUND PRECISION 4.0 (BURR) ×1 IMPLANT
CABLE BIPOLOR RESECTION CORD (MISCELLANEOUS) ×1 IMPLANT
CANISTER SUCTION 3000ML PPV (SUCTIONS) ×1 IMPLANT
CLSR STERI-STRIP ANTIMIC 1/2X4 (GAUZE/BANDAGES/DRESSINGS) IMPLANT
COVER SURGICAL LIGHT HANDLE (MISCELLANEOUS) ×1 IMPLANT
DRAPE POUCH INSTRU U-SHP 10X18 (DRAPES) ×2 IMPLANT
DRAPE SURG 17X23 STRL (DRAPES) ×4 IMPLANT
DURAPREP 26ML APPLICATOR (WOUND CARE) ×1 IMPLANT
ELECT CAUTERY BLADE 6.4 (BLADE) ×1 IMPLANT
ELECTRODE BLDE 4.0 EZ CLN MEGD (MISCELLANEOUS) ×1 IMPLANT
ELECTRODE REM PT RTRN 9FT ADLT (ELECTROSURGICAL) ×1 IMPLANT
EVACUATOR SILICONE 100CC (DRAIN) IMPLANT
FILTER STRAW FLUID ASPIR (MISCELLANEOUS) ×1 IMPLANT
GAUZE 4X4 16PLY ~~LOC~~+RFID DBL (SPONGE) ×2 IMPLANT
GAUZE SPONGE 4X4 12PLY STRL (GAUZE/BANDAGES/DRESSINGS) ×1 IMPLANT
GAUZE SPONGE 4X4 12PLY STRL LF (GAUZE/BANDAGES/DRESSINGS) IMPLANT
GLOVE BIO SURGEON STRL SZ 6.5 (GLOVE) ×1 IMPLANT
GLOVE BIO SURGEON STRL SZ8 (GLOVE) ×1 IMPLANT
GLOVE BIOGEL PI IND STRL 7.0 (GLOVE) ×1 IMPLANT
GLOVE BIOGEL PI IND STRL 8 (GLOVE) ×1 IMPLANT
GLOVE SURG ENC MOIS LTX SZ6.5 (GLOVE) ×1 IMPLANT
GOWN STRL REUS W/ TWL LRG LVL3 (GOWN DISPOSABLE) ×1 IMPLANT
GOWN STRL REUS W/ TWL XL LVL3 (GOWN DISPOSABLE) ×2 IMPLANT
IV CATH 14GX2 1/4 (CATHETERS) ×1 IMPLANT
KIT BASIN OR (CUSTOM PROCEDURE TRAY) ×1 IMPLANT
KIT POSITIONER JACKSON TABLE (MISCELLANEOUS) ×1 IMPLANT
KIT TURNOVER KIT B (KITS) ×1 IMPLANT
MARKER SKIN DUAL TIP RULER LAB (MISCELLANEOUS) ×1 IMPLANT
NDL 18GX1X1/2 (RX/OR ONLY) (NEEDLE) ×1 IMPLANT
NDL 22X1.5 STRL (OR ONLY) (MISCELLANEOUS) ×1 IMPLANT
NDL HYPO 25GX1X1/2 BEV (NEEDLE) ×1 IMPLANT
NDL SPNL 18GX3.5 QUINCKE PK (NEEDLE) ×2 IMPLANT
NEEDLE 18GX1X1/2 (RX/OR ONLY) (NEEDLE) ×1 IMPLANT
NEEDLE 22X1.5 STRL (OR ONLY) (MISCELLANEOUS) ×1 IMPLANT
NEEDLE HYPO 25GX1X1/2 BEV (NEEDLE) ×1 IMPLANT
NEEDLE SPNL 18GX3.5 QUINCKE PK (NEEDLE) ×2 IMPLANT
NS IRRIG 1000ML POUR BTL (IV SOLUTION) ×1 IMPLANT
PACK LAMINECTOMY ORTHO (CUSTOM PROCEDURE TRAY) ×1 IMPLANT
PACK UNIVERSAL I (CUSTOM PROCEDURE TRAY) ×1 IMPLANT
PAD ARMBOARD POSITIONER FOAM (MISCELLANEOUS) ×2 IMPLANT
PATTIES SURGICAL .5 X.5 (GAUZE/BANDAGES/DRESSINGS) IMPLANT
PATTIES SURGICAL .5 X1 (DISPOSABLE) ×1 IMPLANT
SPONGE INTESTINAL PEANUT (DISPOSABLE) ×1 IMPLANT
SPONGE SURGIFOAM ABS GEL SZ50 (HEMOSTASIS) ×1 IMPLANT
STRIP CLOSURE SKIN 1/2X4 (GAUZE/BANDAGES/DRESSINGS) IMPLANT
SURGIFLO W/THROMBIN 8M KIT (HEMOSTASIS) IMPLANT
SUT MNCRL AB 4-0 PS2 18 (SUTURE) ×1 IMPLANT
SUT VIC AB 0 CT1 18XCR BRD 8 (SUTURE) IMPLANT
SUT VIC AB 1 CT1 18XCR BRD 8 (SUTURE) ×1 IMPLANT
SUT VIC AB 2-0 CT2 18 VCP726D (SUTURE) ×1 IMPLANT
SYR 20ML LL LF (SYRINGE) ×1 IMPLANT
SYR BULB IRRIG 60ML STRL (SYRINGE) ×1 IMPLANT
SYR CONTROL 10ML LL (SYRINGE) ×2 IMPLANT
SYR TB 1ML LUER SLIP (SYRINGE) ×4 IMPLANT
TAPE CLOTH SURG 4X10 WHT LF (GAUZE/BANDAGES/DRESSINGS) IMPLANT
TOWEL GREEN STERILE (TOWEL DISPOSABLE) ×1 IMPLANT
TOWEL GREEN STERILE FF (TOWEL DISPOSABLE) ×1 IMPLANT
WATER STERILE IRR 1000ML POUR (IV SOLUTION) ×1 IMPLANT
YANKAUER SUCT BULB TIP NO VENT (SUCTIONS) ×1 IMPLANT

## 2024-05-31 NOTE — Anesthesia Procedure Notes (Addendum)
 Procedure Name: Intubation Date/Time: 05/31/2024 12:22 PM  Performed by: Jolynn Mage, CRNAPre-anesthesia Checklist: Patient identified, Patient being monitored, Timeout performed, Emergency Drugs available and Suction available Patient Re-evaluated:Patient Re-evaluated prior to induction Oxygen Delivery Method: Circle System Utilized Preoxygenation: Pre-oxygenation with 100% oxygen Induction Type: IV induction Ventilation: Mask ventilation without difficulty Laryngoscope Size: Mac and 4 Grade View: Grade I Tube type: Oral Tube size: 7.5 mm Number of attempts: 1 Airway Equipment and Method: Stylet and Bite block Placement Confirmation: ETT inserted through vocal cords under direct vision, positive ETCO2 and breath sounds checked- equal and bilateral Secured at: 22 cm Tube secured with: Tape Dental Injury: Teeth and Oropharynx as per pre-operative assessment

## 2024-05-31 NOTE — Transfer of Care (Signed)
 Immediate Anesthesia Transfer of Care Note  Patient: Robert Mcclain  Procedure(s) Performed: DECOMPRESSIVE LUMBAR LAMINECTOMY LEVEL 2 (Spine Lumbar)  Patient Location: PACU  Anesthesia Type:General  Level of Consciousness: awake, alert , patient cooperative, and responds to stimulation  Airway & Oxygen Therapy: Patient Spontanous Breathing and Patient connected to face mask oxygen  Post-op Assessment: Report given to RN and Post -op Vital signs reviewed and stable  Post vital signs: Reviewed and stable  Last Vitals:  Vitals Value Taken Time  BP    Temp    Pulse 77 05/31/24 14:35  Resp    SpO2 98 % 05/31/24 14:35  Vitals shown include unfiled device data.  Last Pain:  Vitals:   05/31/24 1104  TempSrc:   PainSc: 6       Patients Stated Pain Goal: 2 (05/31/24 1104)  Complications: No notable events documented.

## 2024-05-31 NOTE — H&P (Signed)
 PREOPERATIVE H&P  Chief Complaint: Right leg pain and weakness  HPI: Robert Mcclain is a 63 y.o. male who presents with ongoing pain in the right ilium, as well as progressive weakness  MRI reveals a right-sided L4-5 disc protrusion, and severe right-sided L5-S1 neuroforaminal stenosis.   Patient has failed multiple forms of conservative care and continues to have pain (see office notes for additional details regarding the patient's full course of treatment)  Past Medical History:  Diagnosis Date   Anxiety    Diabetes (HCC)    GERD (gastroesophageal reflux disease)    Parkinson's disease (HCC)    Past Surgical History:  Procedure Laterality Date   COLONOSCOPY     LASIK Bilateral    WISDOM TOOTH EXTRACTION     Social History   Socioeconomic History   Marital status: Married    Spouse name: Not on file   Number of children: 0   Years of education: Not on file   Highest education level: Not on file  Occupational History   Occupation: sales  Tobacco Use   Smoking status: Never   Smokeless tobacco: Never  Vaping Use   Vaping status: Never Used  Substance and Sexual Activity   Alcohol use: Yes    Comment: 12 pack/weekend   Drug use: Never   Sexual activity: Not on file  Other Topics Concern   Not on file  Social History Narrative   Right handed    Lives with wife    Social Drivers of Health   Financial Resource Strain: Not on file  Food Insecurity: Not on file  Transportation Needs: Not on file  Physical Activity: Not on file  Stress: Not on file  Social Connections: Not on file   Family History  Problem Relation Age of Onset   Stroke Mother    Hypertension Father    Stroke Brother    Heart failure Brother    Hypertension Brother    Atrial fibrillation Brother    Diabetes Maternal Uncle    Diabetes Maternal Uncle    No Known Allergies Prior to Admission medications   Medication Sig Start Date End Date Taking? Authorizing Provider   acetaminophen  (TYLENOL ) 325 MG tablet Take 650 mg by mouth every 6 (six) hours as needed.   Yes [provider]  ALPRAZolam (XANAX) 0.5 MG tablet Take 0.5 mg by mouth at bedtime as needed for anxiety.   Yes [provider]  b complex vitamins capsule Take 1 capsule by mouth daily.   Yes [provider]  busPIRone (BUSPAR) 5 MG tablet Take 5 mg by mouth 2 (two) times daily. 09/12/21  Yes [provider]  carbidopa-levodopa (SINEMET IR) 25-100 MG tablet Take 1.5-2 tablets by mouth See admin instructions. Take 2 tablets by mouth every morning, 1.5 tablets at lunch, and 1.5 tablets at dinner.   Yes [provider]  celecoxib (CELEBREX) 100 MG capsule Take 100 mg by mouth daily.   Yes [provider]  diclofenac (VOLTAREN) 50 MG EC tablet Take 50 mg by mouth 2 (two) times daily as needed for moderate pain (pain score 4-6).   Yes [provider]  eszopiclone (LUNESTA) 2 MG TABS tablet Take 2 mg by mouth at bedtime as needed for sleep. 03/28/24  Yes [provider]  finasteride (PROSCAR) 5 MG tablet Take 1.25 mg by mouth daily.   Yes [provider]  gabapentin (NEURONTIN) 300 MG capsule Take 300 mg by mouth at bedtime.  Yes [provider]  HYDROcodone -acetaminophen  (NORCO/VICODIN) 5-325 MG tablet Take 1 tablet by mouth every 6 (six) hours as needed for moderate pain (pain score 4-6).   Yes [provider]  ibuprofen (ADVIL) 200 MG tablet Take 200 mg by mouth every 6 (six) hours as needed for moderate pain (pain score 4-6).   Yes [provider]  JOURNAVX 50 MG TABS Take 50 mg by mouth 2 (two) times daily. 05/03/24  Yes [provider]  Anselm Oil 500 MG CAPS Take 1,000 mg by mouth 2 (two) times daily.   Yes [provider]  loratadine (CLARITIN) 10 MG tablet Take 10 mg by mouth daily.   Yes [provider]  Magnesium 250 MG TABS Take 500 mg by mouth daily.   Yes [provider]  metFORMIN (GLUCOPHAGE) 850 MG tablet Take 850 mg by mouth 2 (two) times daily with a meal.   Yes [provider]  Multiple Vitamin (MULTIVITAMIN) tablet Take 1 tablet by mouth daily.   Yes [provider]  omeprazole (PRILOSEC) 40 MG capsule Take 40 mg by mouth daily.   Yes [provider]  rivastigmine (EXELON) 3 MG capsule Take 3 mg by mouth 2 (two) times daily.   Yes [provider]  diphenoxylate-atropine (LOMOTIL) 2.5-0.025 MG tablet Take 1 tablet by mouth 4 (four) times daily as needed for diarrhea or loose stools.    [provider]  famotidine (PEPCID) 10 MG tablet Take 10 mg by mouth as needed for heartburn or indigestion.    [provider]  fluticasone (FLONASE) 50 MCG/ACT nasal spray Place 1 spray into both nostrils daily as needed for allergies.    [provider]  pantoprazole (PROTONIX) 40 MG tablet Take 40 mg by mouth daily. Patient not taking: Reported on 05/25/2024    [provider]  sildenafil (VIAGRA) 100 MG tablet Take 100 mg by mouth daily as needed for erectile dysfunction.    [provider]  vitamin C (ASCORBIC ACID) 500 MG tablet Take 500 mg by mouth daily.    [provider]     All other systems have been reviewed and were otherwise negative with the exception of those mentioned in the HPI and as above.  Physical Exam: Vitals:   05/31/24 1020 05/31/24 1022  BP: (!) 155/100 (!) 166/97  Pulse: 86   Resp: 18   Temp: 98.4 F (36.9 C)   SpO2: 96%     Body mass index is 25.68 kg/m.  General: Alert, no acute distress Cardiovascular: No pedal edema Respiratory: No cyanosis, no use of accessory musculature Skin: No lesions in the area of chief complaint Neurologic: Sensation intact distally Psychiatric: Patient is competent for consent with normal mood and affect Lymphatic: No axillary or cervical lymphadenopathy  MUSCULOSKELETAL: Patient has noted to  have weakness to EHL on the right with a positive straight leg raise  Assessment/Plan: L4-5, L5-S1 spinal stenosis Plan for Procedure(s): DECOMPRESSIVE LUMBAR LAMINECTOMY LEVEL 2   Oneil LITTIE Priestly, MD 05/31/2024 11:06 AM

## 2024-05-31 NOTE — Op Note (Signed)
 PATIENT NAME: Robert Mcclain   MEDICAL RECORD NO.:   [id number]   DATE OF BIRTH: 23-Jul-1961   DATE OF PROCEDURE: 05/31/2024                               OPERATIVE REPORT   PREOPERATIVE DIAGNOSES: 1.  Right-sided lumbar radiculopathy, manifesting as severe right leg pain and weakness 2.  Spinal stenosis L4-5, L5-S1  POSTOPERATIVE DIAGNOSES: 1.  Right-sided lumbar radiculopathy, manifesting as severe right leg pain and weakness 2.  Spinal stenosis L4-5, L5-S1  PROCEDURE:  L4-5, L5-S1 right-sided laminectomy, foraminotomy, and partial facetectomy, with removal of herniated right-sided L4-5 disc fragment  SURGEON:  Oneil Priestly, MD.  ASSISTANTBETHA Ileana Clara, PA-C.  ANESTHESIA:  General endotracheal anesthesia.  COMPLICATIONS:  None.  DISPOSITION:  Stable.  ESTIMATED BLOOD LOSS:  Minimal.  INDICATIONS FOR SURGERY:  Briefly, Robert Mcclain is a very pleasant 63- year-old male, who did present to me with pain in his right leg.  His symptoms are quite prominent, including prominent weakness to EHL and dorsiflexion on the right, also including severe right leg pain.  I did review his MRI with him, and I did express to him that, although I would not expect his MRI findings to cause symptoms as prominent as his, the findings noted above are the findings that certainly do correlate to his symptoms, and as such, we did discuss proceeding with the surgery noted above.  He did understand the risks associated with surgery, including the possibility of ongoing right leg pain and weakness.  OPERATIVE DETAILS:  On 05/31/2024, the patient was brought to surgery and general endotracheal anesthesia was administered.  The patient was placed prone on a well-padded flat Jackson bed with a spinal frame. Antibiotics were given and the back was prepped and draped in the usual sterile fashion.  A time-out procedure was performed.  I then made a midline incision overlying the L4-5 and L5-S1 intervertebral  spaces.  A curvilinear incision was made just off the midline on the right side at both levels.  An intraoperative radiograph to confirm the appropriate operative levels.  At this point, I turned my attention to the L5-S1 intervertebral space on the right.  A self-retaining McCullough retractor was placed, and at this point, a partial facetectomy was performed on the right at L5-S1.  The ligamentum flavum was identified, and its lateral aspect was removed.  I then continued the partial facetectomy laterally, in order to perform a thorough complete neuroforaminal decompression on the right.  The exiting right L5 nerve was identified, and was noted to be entirely free of compression at the termination of this portion of the procedure.  I then turned my attention to the L4-5 level.  Once again, self-retaining retractor was placed, and once again, a partial facetectomy was performed on the right.  The right lateral recess and foramen was entirely decompressed posteriorly.  At this point, given the MRI findings, I did gently medialized the traversing right L5 nerve, and I did evaluate the L4-5 intervertebral disc space.  There was noted to be a moderate protrusion immediately ventral to the nerve.  An annulotomy was performed over the protrusion, and disc material was removed using various micropituitaries, decompressing the traversing right L5 nerve.  I was very pleased with the decompression accomplished at both the L4-5 and L5-S1 levels. There was no extravasation of cerebrospinal fluid noted throughout the entire surgery.  At this  point, the wound was closed in layers using #1 Vicryl, followed by 2-0 Vicryl, followed by 4- 0 Monocryl.  Benzoin and Steri-Strips were applied, followed by a sterile dressing.  All instrument counts were correct at the termination of the procedure.  Of note, Ileana Clara, PA-C, was my assistant throughout surgery, and did aid in retraction, the decompression, suctioning, and  closure from start to finish.  Oneil Priestly, MD

## 2024-06-01 ENCOUNTER — Encounter (HOSPITAL_COMMUNITY): Payer: Self-pay | Admitting: Orthopedic Surgery

## 2024-06-06 NOTE — Anesthesia Postprocedure Evaluation (Signed)
 Anesthesia Post Note  Patient: Robert Mcclain  Procedure(s) Performed: DECOMPRESSIVE LUMBAR LAMINECTOMY LEVEL 2 (Spine Lumbar)     Patient location during evaluation: PACU Anesthesia Type: General Level of consciousness: awake and alert Pain management: pain level controlled Vital Signs Assessment: post-procedure vital signs reviewed and stable Respiratory status: spontaneous breathing, nonlabored ventilation and respiratory function stable Cardiovascular status: blood pressure returned to baseline and stable Postop Assessment: no apparent nausea or vomiting Anesthetic complications: no   No notable events documented.               Dima Ferrufino

## 2024-08-14 LAB — OPHTHALMOLOGY REPORT-SCANNED

## 2024-08-24 NOTE — Progress Notes (Signed)
 [name] is a 63 y.o. right handed male who first noticed drooling around 2-3 years ago (age [age]) as well as some mild slowness of movements noticed by his PCP on annual exam. On direct questioning, his wife, Robert Mcclain, had noticed him getting slower in the past 2-3 years as well and she also felt that his walking is slower than before. She also noticed that his voice is softer. Patient was referred to Dr. Asberry Tat at [facility] who diagnosed him with Parkinson's disease in 2023.    Medications of relevance Carbidopa/levodopa 25/100 2 - 1.5 - 1.5  tablet   Since last visit Robert Mcclain is a 63 year old male with Parkinson's disease who presents with worsening mobility and pain following back surgery.  He has experienced worsening mobility and pain following back surgery. He reports that inactivity due to the surgery has made his Parkinson's symptoms worse. He has been inactive for about seven months, including four months prior to and three months following the surgery. He mentions walking some but not engaging in his usual exercise routine, which has led to a decline in mobility and increased Parkinson's symptoms.  He has pain in his right foot, described as sciatica, which radiates down the leg. He describes his left leg as 'popping out' during walking. No falls have occurred, but he is concerned about his balance, stating 'your balance is awful.'  He is currently attending physical therapy for pain management but not specifically for Parkinson's. After physical therapy sessions, he experiences significant pain, which affects his ability to engage in activities. He is concerned about the impact of pain on his ability to participate in physical therapy for Parkinson's.  He takes gabapentin 300 mg nightly for nerve pain and sleep. He also takes carbidopa-levodopa, with a regimen of two tablets in the morning, one and a half tablets at lunch, and one and a half tablets at  dinner. He is concerned about the progression of his Parkinson's symptoms, particularly in relation to his mobility and balance.  [place] and mentions a family history of Parkinson's, as his brother also had the condition. He discusses the potential environmental factors related to Parkinson's, such as exposure to pesticides and chemicals, and notes that his Parkinson's symptoms began many years ago.      NEUROIMAGING STUDIES: none available for my review  Outside reports: Available records were personally reviewed by me and details of history and medications changes that were done in the past were noted.   Past Medical History:  Diagnosis Date  . Acid reflux   . Anxiety   . Cervical spondylosis with radiculopathy   . Diabetes mellitus (CMS-HCC)    Past Surgical History:  Procedure Laterality Date  . cyst removed     Family History  Problem Relation Age of Onset  . Stroke Mother   . Hypertension Father   . Aneurysm Father    Social History   Socioeconomic History  . Marital status: Married  Tobacco Use  . Smoking status: Former    Types: Cigars  . Smokeless tobacco: Never  Vaping Use  . Vaping status: Never Used  Substance and Sexual Activity  . Alcohol use: Yes    Alcohol/week: 12.0 standard drinks of alcohol    Types: 12 Cans of beer per week  . Drug use: Never  Other Topics Concern  . Exercise Yes  . Living Situation Yes  Scheduled Meds: Prior to Admission medications   Medication Sig Start Date End Date Taking? Authorizing Provider  ALPRAZolam (XANAX) 0.5 MG tablet  04/08/18  Yes Historical Provider, MD  ascorbic acid, vitamin C, (VITAMIN C) 500 MG tablet Take 500 mg by mouth.   Yes Historical Provider, MD  b complex vitamins capsule Take 1 capsule by mouth.   Yes Historical Provider, MD  diclofenac (VOLTAREN) 50 MG EC tablet  06/14/17  Yes Historical Provider, MD  famotidine (PEPCID ORAL) Take by mouth.   Yes  Historical Provider, MD  finasteride (PROSCAR) 5 mg tablet  04/08/18  Yes Historical Provider, MD  loratadine (CLARITIN) 10 mg tablet Take by mouth.   Yes Historical Provider, MD  metFORMIN (GLUCOPHAGE) 850 MG tablet  06/14/17  Yes Historical Provider, MD  multivitamin (TAB-A-VITE/THERAGRAN) per tablet Take 1 tablet by mouth.   Yes Historical Provider, MD  sildenafil (VIAGRA) 100 MG tablet Take 100 mg by mouth.   Yes Historical Provider, MD  aspirin (ECOTRIN) 81 MG tablet Take by mouth.    Historical Provider, MD  omeprazole (PRILOSEC) 20 MG capsule Take by mouth.    Historical Provider, MD    No Known Allergies  Review of Systems:  12  Systems were  review and were negative except for pertinent items noted in the HPI.  Objective: Vital signs in last 24 hours: BP 135/80 (BP Site: L Arm, BP Position: Sitting, BP Cuff Size: Large)   Pulse 64   Temp 36.4 C (97.5 F) (Temporal)   Ht 177.8 cm (5' 10)   Wt 79.6 kg (175 lb 6.4 oz)   BMI 25.17 kg/m  Vitals:   08/24/24 1440 08/24/24 1446 08/24/24 1451  Orthostatic BP:  138/80 135/86  Orthostatic Pulse:  62 82  BP Site: L Arm L Arm L Arm  BP Position: Sitting Sitting Standing  BP Cuff Size: Large Large Large     GENERAL MEDICAL EXAMINATION: Head was normocephalic and atraumatic, neck was supple with full range of motion. No edema at BLEs.  NEUROLOGICAL EXAMINATION: Mental Status: alert, oriented, with fluent speech, normal language and comprehension.    MOVEMENT DISORDERS EXAMINATION  Facial expression was mild hypomimia  Volume of speech was normal . There was no rest tremor in  upper extremities and lower extremities. There was mild postural tremor in upper extremities bilaterally. There was no action tremor in upper extremities bilaterally. Tone was normal in the neck and tone was mildly increased in the left upper and lower extremities.  Rapid alternating movements were mildly decreased in amplitude and speed with mild breakdown in  bilaterally, left more than right.  Foot tap was mild decrement in amplitude and rhythm bilaterally, left worse than right. He could get up from the chair without help of both hands. Gait was normal - based and steady with deminished heel to toe stride on the right .  Arm swing was deminished on the left and normal on the right.  Has antalgic gait when walking, During gait he has noticeable hip flexion that is mild in severity, suggestive of a lumbar stenotic gait .  Assessment/Plan: In summary, Mr. [name] presents with Parkinson's disease, complicated by slowness of movements, drooling and hypophonia but without postural instability (left body is more affected). Patient was then started on Carbidopa/levodopa 25/100 with good response. Now the biggest issue is lower back pain and balance impairment.  Worsening gait and balance likely due to Parkinson's progression and post-surgical inactivity. - Refer to [facility] for Parkinson's-focused physical therapy. - Increase carbidopa-levodopa to two tablets morning, two midday, one and a half evening. - Encourage daily 20-30 minute recumbent bike exercise. - Monitor mobility and balance improvement with medication and therapy.  Chronic pain after back surgery Persistent right foot and sciatica pain, worsens post-therapy. - Refer to pain management for evaluation and interventions. - Prescribe gabapentin 300 mg twice daily, additional dose pre-physical therapy. - Encourage continuation of current physical therapy.  Orthostatic dizziness - Encourage daily water  with added salt or V8 juice. - Advise consistent daily salt intake. - Monitor dizziness improvement with increased salt and fluids.  Unintentional weight loss Needs to regain approximately 10 pounds. - Encourage calorie-dense foods like full-fat dressings and butter. - Suggest small amounts of ice cream or Rebel ice cream. - Monitor weight and adjust dietary  recommendations.     I encouraged regular aerobic exercises 2-3 times per week about 20 min per day as such exercises shown to be beneficial for general health in patients with Parkinson's disease. The choice of exercise is up to Mr.[name] and we had discussed what resources are available for him in regards of exercises.   I personally spent 44 minutes face-to-face and non-face-to-face in the care of this patient, which includes all pre, intra, and post visit time on the date of service.  All documented time was specific to the E/M visit and does not include any procedures that may have been performed.

## 2024-09-01 NOTE — Therapy (Signed)
 OUTPATIENT PHYSICAL THERAPY NEURO EVALUATION   Patient Name: Robert Mcclain MRN: [id number] DOB:[birth year], 63 y.o., male Today's Date: 09/04/2024   PCP: [name], MD   REFERRING PROVIDER: [name], FNP  END OF SESSION:  PT End of Session - 09/04/24 1104     Visit Number 1    Number of Visits 5    Date for Recertification  10/16/24   due to delay in scheduling   Authorization Type [employer]    PT Start Time 1102    PT Stop Time 1141    PT Time Calculation (min) 39 min    Equipment Utilized During Treatment Gait belt    Activity Tolerance Patient tolerated treatment well;Patient limited by pain    Behavior During Therapy WFL for tasks assessed/performed          Past Medical History:  Diagnosis Date   Anxiety    Diabetes (HCC)    GERD (gastroesophageal reflux disease)    Parkinson's disease (HCC)    Past Surgical History:  Procedure Laterality Date   COLONOSCOPY     DECOMPRESSIVE LUMBAR LAMINECTOMY LEVEL 2 N/A 05/31/2024   Procedure: DECOMPRESSIVE LUMBAR LAMINECTOMY LEVEL 2;  Surgeon: Beuford Anes, MD;  Location: [facility] OR;  Service: Orthopedics;  Laterality: N/A;  LUMBAR 4- LUMBAR 5, LUMBAR 5 - SACRUM 1 DECOMPRESSION   LASIK Bilateral    WISDOM TOOTH EXTRACTION     Patient Active Problem List   Diagnosis Date Noted   Adjustment disorder with depressed mood 10/07/2021   Parkinson's disease (HCC) 09/25/2021   Sialorrhea 09/25/2021    ONSET DATE: 08/28/2024  REFERRING DIAG: G20.A1 (ICD-10-CM) - Parkinson's disease without dyskinesia, without mention of fluctuations M54.41 (ICD-10-CM) - Lumbago with sciatica, right side G89.29 (ICD-10-CM) - Other chronic pain  THERAPY DIAG:  Unsteadiness on feet  Other abnormalities of gait and mobility  Other symptoms and signs involving the nervous system  Pain in right lower leg  Rationale for Evaluation and Treatment: Rehabilitation  SUBJECTIVE:                                                                                                                                                                                              SUBJECTIVE STATEMENT: Had his decompression back surgery mid July 2025. Reports his R ankle and his R foot are off the chart. Takes him about 10 minutes of painful walking to get things going. Will be getting a nerve conduction and vascular study. Will see Dr. Husain and is going to be asking for a vascular work-up. Also has another MRI of his back on the 29th. [facility] reports he should be over this pain by now. Reports his back is not bothersome, and it just stays in the foot and ankle. No longer getting orthopedic PT as that was not helping him. Was working on stretching and core strengthening. Reports R foot feels burning and aching and his R big toe feels like someone double wrapped a rubber band around his toe. Walks the dog for exercises. Has a recumbent bike, but needs to put it together. Able to stand for 15-20 minutes before needing to sit down.   Pt accompanied by: self  PERTINENT HISTORY: PMH: Parkinson's disease, status post L4-S1 decompression on 05/31/2024 for right radiculopathy   Pt with worsening mobility and pain following back surgery  Persistent right foot and sciatica pain, worsens post-therapy.   He has had resolution of his preoperative right leg pain but continued to have pain about the right foot and great toe.   PAIN:  Are you having pain? Yes: NPRS scale: 5/10 Pain location: R ankle/foot  Pain description: Aching, R big toe is burning  Aggravating factors: pushing it too hard, putting weight on his foot   Relieving factors: resting   Vitals:   09/04/24 1128  BP: (!) 150/89  Pulse: 65     PRECAUTIONS: None Pt reports he has been released from back precautions   FALLS: Has patient fallen in last 6 months? No   PLOF: Independent and Leisure: Going to the gym, working at the farm, doing yardwork  Still  driving   Working 69-64 hours a week, pt planning on retiring on January   PATIENT GOALS: Needs to get his balance back   OBJECTIVE:  Note: Objective measures were completed at Evaluation unless otherwise noted.  DIAGNOSTIC FINDINGS: Lumbar spine x-ray 06/11/23: IMPRESSION: Intraoperative lateral views of the lumbar spine demonstrate needle localization of L3 and L5 and subsequently retracting instruments over L5-S1.    COGNITION: Overall cognitive status: Within functional limits for tasks assessed   SENSATION: Light touch: WFL Pt reports some tingling in R foot   COORDINATION: Heel to shin: WNL    POSTURE: rounded shoulders    LOWER EXTREMITY MMT:    MMT Right Eval Left Eval  Hip flexion 5 5  Hip extension    Hip abduction 4 4  Hip adduction 5 5  Hip internal rotation    Hip external rotation    Knee flexion 4+ 5  Knee extension 5 4+  Ankle dorsiflexion 5 5  Ankle plantarflexion    Ankle inversion    Ankle eversion    (Blank rows = not tested)  All tested in sitting   BED MOBILITY:  Pt reports some pain, but no difficulties   TRANSFERS: Sit to stand: Complete Independence  Assistive device utilized: None     Stand to sit: Complete Independence  Assistive device utilized: None       GAIT: Findings: Gait Characteristics: step through pattern, decreased arm swing- Right, decreased arm swing- Left, decreased stance time- Right, decreased stride length, and antalgic, Distance walked: Clinic distances , Assistive device utilized:None, Level of assistance: Modified independence, and Comments: Decr stance time RLE with a limp due to pt's R foot pain, more decr arm swing noted LUE>RUE    FUNCTIONAL TESTS:  5 times sit to stand: 11.3 seconds with no UE support  Timed up and go (TUG): 8.8 seconds  Cog TUG: 11.7 seconds (starting at 77 and counting backwards by 3) 10 meter walk test: 9.1 seconds = 3.60 ft/sec  TREATMENT DATE: 09/04/24   Self-Care: Discussed clinical findings, POC, and pt being referred from neurologist for more PD specific therapy Pt with issues with R foot/ankle after his back surgery that is affecting his gait/mobility and standing, was working prior with ortho PT and is now being recommended to have a nerve test/vascular testing done and repeat MRI. Discussed that we can try some PD specific exercises/HEP (may have to do more seated/other positions if pt unable to tolerate standing) and to try to work on some balance (pt reports his balance has gotten worse) or that if pt gets further information with testing then can go on hold. Pt in agreement with this plan and will get scheduled for 1x a week   PATIENT EDUCATION: Education details: See self-care above  Person educated: Patient Education method: Explanation Education comprehension: verbalized understanding  HOME EXERCISE PROGRAM: Will provide at future session   GOALS: Goals reviewed with patient? Yes  SHORT TERM GOALS: ALL STGS = LTGS   LONG TERM GOALS: Target date: 10/02/2024  Pt will be independent with final HEP for PD specific deficits in order to build upon functional gains made in therapy. Baseline:  Goal status: INITIAL  2.  miniBEST to be assessed with LTG written.  Baseline:  Goal status: INITIAL  3.  Pt will improve gait speed with no AD to at least 3.9 ft/sec in order to demo improved gait efficiency.   Baseline: 9.1 seconds = 3.60 ft/sec   Goal status: INITIAL   ASSESSMENT:  CLINICAL IMPRESSION: Patient is a 63 year old male referred to Neuro OPPT for PD/pain.   Pt's PMH is significant for: Parkinson's disease, status post L4-S1 decompression on 05/31/2024 for right radiculopathy. After recent back surgery, pt now with worsening pain in R foot and ankle. Pt reports his back is no longer bothering him. Pt had previously  worked with ortho PT after his surgery, but reports that it did not help. They are recommending pt get a nerve study and vascular testing for R foot. Pt being referred to neuro PT from neurologist to try to work on mobility as pt has not been able to exercise after surgery.  The following deficits were present during the exam: gait abnormalities, decr strength, impaired timing/coordination of gait, impaired balance, impaired sensation, R foot/ankle pain.  Pt reports his balance has been more off, will perform miniBEST at next session as able. Pt would benefit from skilled PT to address these impairments and functional limitations to maximize functional mobility independence. Will perform PD specific PT as appropriate to what pt can tolerate.    OBJECTIVE IMPAIRMENTS: Abnormal gait, decreased activity tolerance, decreased balance, decreased mobility, difficulty walking, decreased strength, impaired sensation, and pain.   ACTIVITY LIMITATIONS: standing and locomotion level  PARTICIPATION LIMITATIONS: community activity, yard work, and exercising   PERSONAL FACTORS: Past/current experiences, Time since onset of injury/illness/exacerbation, and 1-2 comorbidities: Parkinson's disease, status post L4-S1 decompression on 05/31/2024 for right radiculopathy  are also affecting patient's functional outcome.   REHAB POTENTIAL: Good  CLINICAL DECISION MAKING: Evolving/moderate complexity  EVALUATION COMPLEXITY: Moderate  PLAN:  PT FREQUENCY: 1x/week  PT DURATION: 6 weeks - due to delay in scheduling   PLANNED INTERVENTIONS: 97164- PT Re-evaluation, 97110-Therapeutic exercises, 97530- Therapeutic activity, 97112- Neuromuscular re-education, 97535- Self Care, 02859- Manual therapy, [phone]- Gait training, Patient/Family education, and Balance training  PLAN FOR NEXT SESSION: how is R foot doing? Did pt get more imaging or testing done? Perform miniBEST and write goal. Initiate PD  HEP - can try either seated  or quadruped PWR moves what pt can tolerate.    Sheffield LOISE Senate, PT, DPT 09/04/2024, 11:48 AM

## 2024-09-04 ENCOUNTER — Encounter: Payer: Self-pay | Admitting: Physical Therapy

## 2024-09-04 ENCOUNTER — Ambulatory Visit: Attending: Neurology | Admitting: Physical Therapy

## 2024-09-04 VITALS — BP 150/89 | HR 65

## 2024-09-04 DIAGNOSIS — M79661 Pain in right lower leg: Secondary | ICD-10-CM | POA: Diagnosis present

## 2024-09-04 DIAGNOSIS — R2681 Unsteadiness on feet: Secondary | ICD-10-CM | POA: Insufficient documentation

## 2024-09-04 DIAGNOSIS — R2689 Other abnormalities of gait and mobility: Secondary | ICD-10-CM | POA: Insufficient documentation

## 2024-09-04 DIAGNOSIS — R29818 Other symptoms and signs involving the nervous system: Secondary | ICD-10-CM | POA: Diagnosis present

## 2024-09-05 ENCOUNTER — Other Ambulatory Visit: Payer: Self-pay | Admitting: Internal Medicine

## 2024-09-05 DIAGNOSIS — M79671 Pain in right foot: Secondary | ICD-10-CM

## 2024-09-13 ENCOUNTER — Ambulatory Visit
Admission: RE | Admit: 2024-09-13 | Discharge: 2024-09-13 | Disposition: A | Source: Ambulatory Visit | Attending: Internal Medicine | Admitting: Internal Medicine

## 2024-09-13 ENCOUNTER — Ambulatory Visit

## 2024-09-13 DIAGNOSIS — M79671 Pain in right foot: Secondary | ICD-10-CM

## 2024-09-25 ENCOUNTER — Encounter: Payer: Self-pay | Admitting: Physical Therapy

## 2024-09-25 ENCOUNTER — Ambulatory Visit: Attending: Neurology | Admitting: Physical Therapy

## 2024-09-25 DIAGNOSIS — R2689 Other abnormalities of gait and mobility: Secondary | ICD-10-CM | POA: Insufficient documentation

## 2024-09-25 DIAGNOSIS — M79661 Pain in right lower leg: Secondary | ICD-10-CM | POA: Diagnosis present

## 2024-09-25 DIAGNOSIS — R29818 Other symptoms and signs involving the nervous system: Secondary | ICD-10-CM | POA: Insufficient documentation

## 2024-09-25 DIAGNOSIS — R2681 Unsteadiness on feet: Secondary | ICD-10-CM | POA: Insufficient documentation

## 2024-09-25 NOTE — Therapy (Signed)
 OUTPATIENT PHYSICAL THERAPY NEURO TREATMENT   Patient Name: Robert Mcclain MRN: [id number] DOB:1961/05/25, 63 y.o., male Today's Date: 09/25/2024   PCP: [name], MD   REFERRING PROVIDER: [name], FNP  END OF SESSION:  PT End of Session - 09/25/24 1104     Visit Number 2    Number of Visits 5    Date for Recertification  10/16/24   due to delay in scheduling   Authorization Type [employer]    PT Start Time 1104    PT Stop Time 1142    PT Time Calculation (min) 38 min    Equipment Utilized During Treatment Gait belt    Activity Tolerance Patient tolerated treatment well;Patient limited by pain    Behavior During Therapy WFL for tasks assessed/performed          Past Medical History:  Diagnosis Date   Anxiety    Diabetes (HCC)    GERD (gastroesophageal reflux disease)    Parkinson's disease (HCC)    Past Surgical History:  Procedure Laterality Date   COLONOSCOPY     DECOMPRESSIVE LUMBAR LAMINECTOMY LEVEL 2 N/A 05/31/2024   Procedure: DECOMPRESSIVE LUMBAR LAMINECTOMY LEVEL 2;  Surgeon: Beuford Anes, MD;  Location: [facility] OR;  Service: Orthopedics;  Laterality: N/A;  LUMBAR 4- LUMBAR 5, LUMBAR 5 - SACRUM 1 DECOMPRESSION   LASIK Bilateral    WISDOM TOOTH EXTRACTION     Patient Active Problem List   Diagnosis Date Noted   Adjustment disorder with depressed mood 10/07/2021   Parkinson's disease (HCC) 09/25/2021   Sialorrhea 09/25/2021    ONSET DATE: 08/28/2024  REFERRING DIAG: G20.A1 (ICD-10-CM) - Parkinson's disease without dyskinesia, without mention of fluctuations M54.41 (ICD-10-CM) - Lumbago with sciatica, right side G89.29 (ICD-10-CM) - Other chronic pain  THERAPY DIAG:  Unsteadiness on feet  Other abnormalities of gait and mobility  Rationale for Evaluation and Treatment: Rehabilitation  SUBJECTIVE:                                                                                                                                                                                              SUBJECTIVE STATEMENT: Has an MRI on Wednesday on his R foot and ankle as it is still bothersome. Has nerve testing at the end of the month on the 25th. Pain is staying about the same. Saw the physician at [facility] the other day and was given a Toradol injection for pain. Also had a vascular study since he was last here for PT eval and it said Normal noninvasive vascular study of the lower extremities.   Pt accompanied by: self  PERTINENT HISTORY: PMH: Parkinson's disease, status post L4-S1 decompression on 05/31/2024 for right radiculopathy   Pt with worsening mobility and pain following back surgery  Persistent right foot and sciatica pain, worsens post-therapy.   He has had resolution of his preoperative right leg pain but continued to have pain about the right foot and great toe.   PAIN:  Are you having pain? Yes: NPRS scale: 6/10 Pain location: R ankle/foot  Pain description: Aching, R big toe is burning  Aggravating factors: pushing it too hard, putting weight on his foot   Relieving factors: resting   There were no vitals filed for this visit.    PRECAUTIONS: None Pt reports he has been released from back precautions   FALLS: Has patient fallen in last 6 months? 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  DIAGNOSTIC FINDINGS: Lumbar spine x-ray 06/11/23: IMPRESSION: Intraoperative lateral views of the lumbar spine demonstrate needle localization of L3 and L5 and subsequently retracting instruments over L5-S1.    COGNITION: Overall cognitive status: Within functional limits for tasks assessed   SENSATION: Light touch: WFL Pt reports some  tingling in R foot   COORDINATION: Heel to shin: WNL    POSTURE: rounded shoulders    LOWER EXTREMITY MMT:    MMT Right Eval Left Eval  Hip flexion 5 5  Hip extension    Hip abduction 4 4  Hip adduction 5 5  Hip internal rotation    Hip external rotation    Knee flexion 4+ 5  Knee extension 5 4+  Ankle dorsiflexion 5 5  Ankle plantarflexion    Ankle inversion    Ankle eversion    (Blank rows = not tested)  All tested in sitting   BED MOBILITY:  Pt reports some pain, but no difficulties   TRANSFERS: Sit to stand: Complete Independence  Assistive device utilized: None     Stand to sit: Complete Independence  Assistive device utilized: None       GAIT: Findings: Gait Characteristics: step through pattern, decreased arm swing- Right, decreased arm swing- Left, decreased stance time- Right, decreased stride length, and antalgic, Distance walked: Clinic distances , Assistive device utilized:None, Level of assistance: Modified independence, and Comments: Decr stance time RLE with a limp due to pt's R foot pain, more decr arm swing noted LUE>RUE    FUNCTIONAL TESTS:  5 times sit to stand: 11.3 seconds with no UE support  Timed up and go (TUG): 8.8 seconds  Cog TUG: 11.7 seconds (starting at 77 and counting backwards by 3) 10 meter walk test: 9.1 seconds = 3.60 ft/sec  TREATMENT DATE: 09/04/24   Therapeutic Activity:   GAIT: Findings:  Gait Characteristics: step through pattern, decreased arm swing- Right, decreased arm swing- Left, decreased stance time- Right, decreased stride length, and antalgic, appears some L knee hyperextension as well? And noted in stance - pt unaware of this  Distance walked: Clinic distances ,  Assistive device utilized:None,  Level of assistance: Modified independence, and  Comments: Decr stance time RLE with a  limp due to pt's R foot pain, more decr arm swing noted LUE>RUE   Also trialed a single walking pole for 230' x 1 with holding it in LUE to see if it helps off weight pt's R foot and to help with balance, initial cues for proper sequencing, pt reports no change in R foot discomfort, but will plan to use it for longer distances or in an environment where his balance may be challenged    [facility] PT Assessment - 09/25/24 1121       Standardized Balance Assessment   Standardized Balance Assessment Mini-BESTest;Timed Up and Go Test      Mini-BESTest   Sit To Stand Normal: Comes to stand without use of hands and stabilizes independently.    Rise to Toes Moderate: Heels up, but not full range (smaller than when holding hands), OR noticeable instability for 3 s.    Stand on one leg (left) Moderate: < 20 s   3.6, 1.8   Stand on one leg (right) Moderate: < 20 s   ~1 second   Stand on one leg - lowest score 1    Compensatory Stepping Correction - Forward Normal: Recovers independently with a single, large step (second realignement is allowed).    Compensatory Stepping Correction - Backward Moderate: More than one step is required to recover equilibrium   2 small steps   Compensatory Stepping Correction - Left Lateral Normal: Recovers independently with 1 step (crossover or lateral OK)    Compensatory Stepping Correction - Right Lateral Moderate: Several steps to recover equilibrium   2 steps   Stepping Corredtion Lateral - lowest score 1    Stance - Feet together, eyes open, firm surface  Normal: 30s    Stance - Feet together, eyes closed, foam surface  Normal: 30s    Incline - Eyes Closed Normal: Stands independently 30s and aligns with gravity    Change in Gait Speed Moderate: Unable to change walking speed or signs of imbalance    Walk with head turns - Horizontal Moderate: performs head turns with reduction in gait speed.    Walk with pivot turns Normal: Turns with feet close FAST (< 3 steps) with  good balance.    Step over obstacles Normal: Able to step over box with minimal change of gait speed and with good balance.    Timed UP & GO with Dual Task Normal: No noticeable change in sitting, standing or walking while backward counting when compared to TUG without    Mini-BEST total score 22      Timed Up and Go Test   Normal TUG (seconds) 9.1    Cognitive TUG (seconds) 10.1         Pt reports that he ordered a recumbent bike for home to work on aerobic exercise, but still has to put it together   NMR:  Pt performs PWR! Moves in standing position x 10 reps, performed with chair in front of pt as needed for balance    PWR! Up for improved posture  PWR! [employer]  for improved weighshifting  PWR! Twist for improved trunk rotation   PWR! Step for improved step initiation   Cues provided for technique, larger amplitude movements. Educated on relation to function   Pt reports R foot felt ok, just felt more imbalanced with SLS on R side    PATIENT EDUCATION: Education details: Standing PWR moves, results of miniBEST, used of walking pole with gait  Person educated: Patient Education method: Programmer, Multimedia, Demonstration, Verbal cues, and Handouts Education comprehension: verbalized understanding and returned demonstration  HOME EXERCISE PROGRAM: Standing PWR Moves   GOALS: Goals reviewed with patient? Yes  SHORT TERM GOALS: ALL STGS = LTGS   LONG TERM GOALS: Target date: 10/02/2024  Pt will be independent with final HEP for PD specific deficits in order to build upon functional gains made in therapy. Baseline:  Goal status: INITIAL  2.  Pt will improve miniBEST to at least a 25/28 in order to demo decr fall risk.  Baseline: 22/28 Goal status: INITIAL  3.  Pt will improve gait speed with no AD to at least 3.9 ft/sec in order to demo improved gait efficiency.   Baseline: 9.1 seconds = 3.60 ft/sec   Goal status: INITIAL   ASSESSMENT:  CLINICAL IMPRESSION: Today's  skilled session focused on assessing miniBEST to further assess pt's balance. Pt scored a 22/28, with pt having most difficulty with SLS, anterior weight shift, posterior and lateral stepping strategies. LTG updated as appropriate. Pt's R foot is still bothersome and has followed up with the orthopedist. Has had a vascular study of his legs and it was normal. Trialed use of single walking pole to help assist with balance with gait, with pt planning on using for longer distances. Gave pt standing PWR moves for HEP and pt able to tolerate well with no reports of incr pain, just felt more unsteady with SLS on RLE. Will continue per POC as able.      OBJECTIVE IMPAIRMENTS: Abnormal gait, decreased activity tolerance, decreased balance, decreased mobility, difficulty walking, decreased strength, impaired sensation, and pain.   ACTIVITY LIMITATIONS: standing and locomotion level  PARTICIPATION LIMITATIONS: community activity, yard work, and exercising   PERSONAL FACTORS: Past/current experiences, Time since onset of injury/illness/exacerbation, and 1-2 comorbidities: Parkinson's disease, status post L4-S1 decompression on 05/31/2024 for right radiculopathy  are also affecting patient's functional outcome.   REHAB POTENTIAL: Good  CLINICAL DECISION MAKING: Evolving/moderate complexity  EVALUATION COMPLEXITY: Moderate  PLAN:  PT FREQUENCY: 1x/week  PT DURATION: 6 weeks - due to delay in scheduling   PLANNED INTERVENTIONS: 97164- PT Re-evaluation, 97110-Therapeutic exercises, 97530- Therapeutic activity, 97112- Neuromuscular re-education, 97535- Self Care, 02859- Manual therapy, [phone]- Gait training, Patient/Family education, and Balance training  PLAN FOR NEXT SESSION: how is R foot doing? Did pt get more imaging or testing done? Try SciFit, work on balance as able with SLS, lateral and posterior stepping    [place] LOISE Senate, PT, DPT 09/25/2024, 12:14 PM

## 2024-10-02 ENCOUNTER — Ambulatory Visit: Admitting: Physical Therapy

## 2024-10-02 ENCOUNTER — Encounter: Payer: Self-pay | Admitting: Physical Therapy

## 2024-10-02 DIAGNOSIS — R29818 Other symptoms and signs involving the nervous system: Secondary | ICD-10-CM

## 2024-10-02 DIAGNOSIS — R2681 Unsteadiness on feet: Secondary | ICD-10-CM

## 2024-10-02 DIAGNOSIS — R2689 Other abnormalities of gait and mobility: Secondary | ICD-10-CM

## 2024-10-02 NOTE — Therapy (Signed)
 OUTPATIENT PHYSICAL THERAPY NEURO TREATMENT   Patient Name: Robert Mcclain MRN: [id number] DOB:08/17/1961, 63 y.o., male Today's Date: 10/02/2024   PCP: [name], MD   REFERRING PROVIDER: [name], FNP  END OF SESSION:  PT End of Session - 10/02/24 1058     Visit Number 3    Number of Visits 5    Date for Recertification  10/16/24   due to delay in scheduling   Authorization Type [employer]    PT Start Time 1058    PT Stop Time 1140    PT Time Calculation (min) 42 min    Equipment Utilized During Treatment Gait belt    Activity Tolerance Patient tolerated treatment well    Behavior During Therapy WFL for tasks assessed/performed          Past Medical History:  Diagnosis Date   Anxiety    Diabetes (HCC)    GERD (gastroesophageal reflux disease)    Parkinson's disease (HCC)    Past Surgical History:  Procedure Laterality Date   COLONOSCOPY     DECOMPRESSIVE LUMBAR LAMINECTOMY LEVEL 2 N/A 05/31/2024   Procedure: DECOMPRESSIVE LUMBAR LAMINECTOMY LEVEL 2;  Surgeon: Beuford Anes, MD;  Location: [facility] OR;  Service: Orthopedics;  Laterality: N/A;  LUMBAR 4- LUMBAR 5, LUMBAR 5 - SACRUM 1 DECOMPRESSION   LASIK Bilateral    WISDOM TOOTH EXTRACTION     Patient Active Problem List   Diagnosis Date Noted   Adjustment disorder with depressed mood 10/07/2021   Parkinson's disease (HCC) 09/25/2021   Sialorrhea 09/25/2021    ONSET DATE: 08/28/2024  REFERRING DIAG: G20.A1 (ICD-10-CM) - Parkinson's disease without dyskinesia, without mention of fluctuations M54.41 (ICD-10-CM) - Lumbago with sciatica, right side G89.29 (ICD-10-CM) - Other chronic pain  THERAPY DIAG:  Other abnormalities of gait and mobility  Unsteadiness on feet  Other symptoms and signs involving the nervous system  Rationale for Evaluation and Treatment: Rehabilitation  SUBJECTIVE:                                                                                                                                                                                              SUBJECTIVE STATEMENT: 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 Feels like his R foot is doing a little better. Going to have his nerve conduction study at the end of the month. Tried the exercises and they went well. Pt still has to put his recumbent bike together.   Pt accompanied by: self  PERTINENT HISTORY: PMH: Parkinson's disease, status post L4-S1 decompression on 05/31/2024 for right  radiculopathy   Pt with worsening mobility and pain following back surgery  Persistent right foot and sciatica pain, worsens post-therapy.   He has had resolution of his preoperative right leg pain but continued to have pain about the right foot and great toe.   PAIN:  Are you having pain? Yes: NPRS scale: 5/10 Pain location: R ankle/foot  Pain description: Aching, R big toe is burning  Aggravating factors: pushing it too hard, putting weight on his foot   Relieving factors: resting   Notes back pain is a 2-3/10 today   There were no vitals filed for this visit.  PRECAUTIONS: None Pt reports he has been released from back precautions   FALLS: Has patient fallen in last 6 months? 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  DIAGNOSTIC FINDINGS: Lumbar spine x-ray 06/11/23: IMPRESSION: Intraoperative lateral views of the lumbar spine demonstrate needle localization of L3 and L5 and subsequently retracting instruments over L5-S1.    COGNITION: Overall cognitive status: Within functional limits for tasks assessed   SENSATION: Light touch: WFL Pt reports some tingling in R foot    COORDINATION: Heel to shin: WNL    POSTURE: rounded shoulders    LOWER EXTREMITY MMT:    MMT Right Eval Left Eval  Hip flexion 5 5  Hip extension    Hip abduction 4 4  Hip adduction 5 5  Hip internal rotation    Hip external rotation    Knee flexion 4+ 5  Knee extension 5 4+  Ankle dorsiflexion 5 5  Ankle plantarflexion    Ankle inversion    Ankle eversion    (Blank rows = not tested)  All tested in sitting   BED MOBILITY:  Pt reports some pain, but no difficulties   TRANSFERS: Sit to stand: Complete Independence  Assistive device utilized: None     Stand to sit: Complete Independence  Assistive device utilized: None       GAIT: Findings: Gait Characteristics: step through pattern, decreased arm swing- Right, decreased arm swing- Left, decreased stance time- Right, decreased stride length, and antalgic, Distance walked: Clinic distances , Assistive device utilized:None, Level of assistance: Modified independence, and Comments: Decr stance time RLE with a limp due to pt's R foot pain, more decr arm swing noted LUE>RUE    FUNCTIONAL TESTS:  5 times sit to stand: 11.3 seconds with no UE support  Timed up and go (TUG): 8.8 seconds  Cog TUG: 11.7 seconds (starting at 77 and counting backwards by 3) 10 meter walk test: 9.1 seconds = 3.60 ft/sec  TREATMENT DATE: 10/02/24  Therapeutic Activity:   SciFit with BUE/BLE Multi-Peaks at Gear 4.0 for 8 minutes for reciprocal movement patterns, larger amplitude movements, neural priming. Pt reporting RPE as 6/10. Discussed working at a moderate level of intensity for exercise when pt uses his recumbent bike at home, and importance of working at this level of intensity   GAIT: Findings:  Gait Characteristics: step through pattern, decreased arm swing- Right, decreased arm swing- Left, decreased  stance time- Right, decreased stride length, and antalgic, appears some L knee hyperextension as well? And noted in stance - pt unaware of this  Distance walked: Clinic distances ,  Assistive device utilized:None,  Level of assistance: Modified independence, and  Comments: Decr stance time RLE with a limp due to pt's R foot pain, more decr arm swing noted LUE>RUE, pt with L knee hyperextension during gait and with stance. When asked if pt has noticed this, pt reports that since his R foot/RLE has been hurting has been compensating more with his LLE    NMR:  On rockerboard in A/P direction: Alternating SLS taps to 2 cones 15 reps each side Alternating forward step ups with contralateral march 10 reps each side  Pt needing frequent fingertip support for balance, cues to keep a soft bend in his LLE with SLS to prevent L knee hyperextension  With rebounder and standing on blue foam beam: Cues to keep soft bend in the knees and throwing red ball to trampoline and catching 10 reps for improved coordination  Alternating forward stepping and toss and catch, performed 10 reps each side  Alternating lateral step and catch, with cues to hold mini squat position the whole time, 10 reps each side Pt needing cues intermittently for sequencing and to slow movement down  With 4 larger orange obstacles and 2 air ex pads in between each: Working on alternating stepping over obstacles, down and back x5 reps, cues for slowed/controlled and clearing obstacles with hip/knee flexion, pt needing intermittent UE support for balance   PATIENT EDUCATION: Education details: Continue HEP Person educated: Patient Education method: Programmer, Multimedia, Facilities Manager, Verbal cues, and Handouts Education comprehension: verbalized understanding and returned demonstration  HOME EXERCISE PROGRAM: Standing PWR Moves  Standing marching at countertop   GOALS: Goals reviewed with patient? Yes  SHORT TERM GOALS: ALL STGS = LTGS    LONG TERM GOALS: Target date: 10/02/2024  Pt will be independent with final HEP for PD specific deficits in order to build upon functional gains made in therapy. Baseline:  Goal status: INITIAL  2.  Pt will improve miniBEST to at least a 25/28 in order to demo decr fall risk.  Baseline: 22/28 Goal status: INITIAL  3.  Pt will improve gait speed with no AD to at least 3.9 ft/sec in order to demo improved gait efficiency.   Baseline: 9.1 seconds = 3.60 ft/sec   Goal status: INITIAL   ASSESSMENT:  CLINICAL IMPRESSION: Today's skilled session focused on SciFit for neural priming/large amplitude movements and standing balance tasks working on stepping and SLS stability. Pt needs intermittent cues during session for sequencing and for slowing movement down. Pt does demonstrate L knee hyperextension with standing and SLS tasks with cues to keep a soft knee. Pt reports that he has been overcompensating with his LLE due to his current/prior RLE foot/back/LE pain. Pt reporting no incr in pain at end of session and able to tolerate SLS tasks well with intermittent UE support for balance. Will continue per POC as able.  OBJECTIVE IMPAIRMENTS: Abnormal gait, decreased activity tolerance, decreased balance, decreased mobility, difficulty walking, decreased strength, impaired sensation, and pain.   ACTIVITY LIMITATIONS: standing and locomotion level  PARTICIPATION LIMITATIONS: community activity, yard work, and exercising   PERSONAL FACTORS: Past/current experiences, Time since onset of injury/illness/exacerbation, and 1-2 comorbidities: Parkinson's disease, status post L4-S1 decompression on 05/31/2024 for right radiculopathy  are also affecting patient's functional outcome.   REHAB POTENTIAL: Good  CLINICAL DECISION MAKING: Evolving/moderate complexity  EVALUATION COMPLEXITY: Moderate  PLAN:  PT FREQUENCY: 1x/week  PT DURATION: 6 weeks - due to delay in scheduling   PLANNED  INTERVENTIONS: 97164- PT Re-evaluation, 97110-Therapeutic exercises, 97530- Therapeutic activity, 97112- Neuromuscular re-education, 97535- Self Care, 02859- Manual therapy, [phone]- Gait training, Patient/Family education, and Balance training  PLAN FOR NEXT SESSION: CHECK LTGS  how is R foot doing? Did pt get more imaging or testing done? Try SciFit, work on balance as able with SLS, lateral and posterior stepping    [place]ffield LOISE Senate, PT, DPT 10/02/2024, 12:44 PM

## 2024-10-09 ENCOUNTER — Ambulatory Visit: Admitting: Physical Therapy

## 2024-10-09 VITALS — BP 150/90 | HR 66

## 2024-10-09 DIAGNOSIS — M79661 Pain in right lower leg: Secondary | ICD-10-CM

## 2024-10-09 DIAGNOSIS — R2689 Other abnormalities of gait and mobility: Secondary | ICD-10-CM

## 2024-10-09 DIAGNOSIS — R2681 Unsteadiness on feet: Secondary | ICD-10-CM

## 2024-10-09 DIAGNOSIS — R29818 Other symptoms and signs involving the nervous system: Secondary | ICD-10-CM

## 2024-10-09 NOTE — Therapy (Signed)
 OUTPATIENT PHYSICAL THERAPY NEURO TREATMENT   Patient Name: Robert Mcclain MRN: [id number] DOB:Aug 22, 1961, 63 y.o., male Today's Date: 10/09/2024   PCP: [name], MD   REFERRING PROVIDER: [name], FNP  END OF SESSION:  PT End of Session - 10/09/24 1249     Visit Number 4    Number of Visits 5    Date for Recertification  10/16/24   due to delay in scheduling   Authorization Type [employer]    PT Start Time 1017    PT Stop Time 1059    PT Time Calculation (min) 42 min    Equipment Utilized During Treatment Gait belt    Activity Tolerance Patient tolerated treatment well    Behavior During Therapy WFL for tasks assessed/performed           Past Medical History:  Diagnosis Date   Anxiety    Diabetes (HCC)    GERD (gastroesophageal reflux disease)    Parkinson's disease (HCC)    Past Surgical History:  Procedure Laterality Date   COLONOSCOPY     DECOMPRESSIVE LUMBAR LAMINECTOMY LEVEL 2 N/A 05/31/2024   Procedure: DECOMPRESSIVE LUMBAR LAMINECTOMY LEVEL 2;  Surgeon: Beuford Anes, MD;  Location: [facility] OR;  Service: Orthopedics;  Laterality: N/A;  LUMBAR 4- LUMBAR 5, LUMBAR 5 - SACRUM 1 DECOMPRESSION   LASIK Bilateral    WISDOM TOOTH EXTRACTION     Patient Active Problem List   Diagnosis Date Noted   Adjustment disorder with depressed mood 10/07/2021   Parkinson's disease (HCC) 09/25/2021   Sialorrhea 09/25/2021    ONSET DATE: 08/28/2024  REFERRING DIAG: G20.A1 (ICD-10-CM) - Parkinson's disease without dyskinesia, without mention of fluctuations M54.41 (ICD-10-CM) - Lumbago with sciatica, right side G89.29 (ICD-10-CM) - Other chronic pain  THERAPY DIAG:  Other abnormalities of gait and mobility  Unsteadiness on feet  Other symptoms and signs involving the nervous system  Pain in right lower leg  Rationale for Evaluation and Treatment: Rehabilitation  SUBJECTIVE:                                                                                                                                                                                              SUBJECTIVE STATEMENT: Patient ambulates into clinic without AD. Patient states that his ankle feels better and that the exercises/stretching has been helping a lot. Patient denies falls.  Pt accompanied by: self  PERTINENT HISTORY: PMH: Parkinson's disease, status post L4-S1 decompression on 05/31/2024 for right radiculopathy   Pt with worsening mobility and pain following back surgery  Persistent right foot and sciatica pain, worsens post-therapy.   He  has had resolution of his preoperative right leg pain but continued to have pain about the right foot and great toe.   PAIN:  Are you having pain? Yes: NPRS scale: 5/10 Pain location: R ankle/foot  Pain description: Aching, R big toe is burning  Aggravating factors: pushing it too hard, putting weight on his foot   Relieving factors: resting   Notes back pain is a 2-3/10 today   Vitals:   10/09/24 1028  BP: (!) 150/90  Pulse: 66    PRECAUTIONS: None Pt reports he has been released from back precautions   FALLS: Has patient fallen in last 6 months? 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  DIAGNOSTIC FINDINGS: Lumbar spine x-ray 06/11/23: IMPRESSION: Intraoperative lateral views of the lumbar spine demonstrate needle localization of L3 and L5 and subsequently retracting instruments over L5-S1.    COGNITION: Overall cognitive status: Within functional limits for tasks assessed   SENSATION: Light touch: WFL Pt reports some tingling in R foot   COORDINATION: Heel to shin: WNL    POSTURE: rounded shoulders    LOWER EXTREMITY MMT:     MMT Right Eval Left Eval  Hip flexion 5 5  Hip extension    Hip abduction 4 4  Hip adduction 5 5  Hip internal rotation    Hip external rotation    Knee flexion 4+ 5  Knee extension 5 4+  Ankle dorsiflexion 5 5  Ankle plantarflexion    Ankle inversion    Ankle eversion    (Blank rows = not tested)  All tested in sitting   BED MOBILITY:  Pt reports some pain, but no difficulties   TRANSFERS: Sit to stand: Complete Independence  Assistive device utilized: None     Stand to sit: Complete Independence  Assistive device utilized: None       GAIT: Findings: Gait Characteristics: step through pattern, decreased arm swing- Right, decreased arm swing- Left, decreased stance time- Right, decreased stride length, and antalgic, Distance walked: Clinic distances , Assistive device utilized:None, Level of assistance: Modified independence, and Comments: Decr stance time RLE with a limp due to pt's R foot pain, more decr arm swing noted LUE>RUE    FUNCTIONAL TESTS:  5 times sit to stand: 11.3 seconds with no UE support  Timed up and go (TUG): 8.8 seconds  Cog TUG: 11.7 seconds (starting at 77 and counting backwards by 3) 10 meter walk test: 9.1 seconds = 3.60 ft/sec                                                                                                                                TREATMENT  DATE: 10/02/24  Therapeutic Activity:   Vitals:   10/09/24 1028  BP: (!) 150/90  Pulse: 66    Goal assessment and review/education of results and goals going forward, especially after pt has review of MRI results coming up. Education on improvement of compensatory stepping in all directions and ability to change gait speed during ambulation and successfully completing head turns with ambulation. Education on difficulty with rising to toes and single leg stance.  Minibest assessment, 26/28:  [facility] PT Assessment - 10/09/24 0001       Mini-BESTest   Sit To Stand Normal: Comes to  stand without use of hands and stabilizes independently.    Rise to Toes Moderate: Heels up, but not full range (smaller than when holding hands), OR noticeable instability for 3 s.    Stand on one leg (left) Moderate: < 20 s   12 seconds   Stand on one leg (right) Moderate: < 20 s   3 seconds   Stand on one leg - lowest score 1    Compensatory Stepping Correction - Forward Normal: Recovers independently with a single, large step (second realignement is allowed).    Compensatory Stepping Correction - Backward Normal: Recovers independently with a single, large step    Compensatory Stepping Correction - Left Lateral Normal: Recovers independently with 1 step (crossover or lateral OK)    Compensatory Stepping Correction - Right Lateral Normal: Recovers independently with 1 step (crossover or lateral OK)    Stepping Corredtion Lateral - lowest score 2    Stance - Feet together, eyes open, firm surface  Normal: 30s    Stance - Feet together, eyes closed, foam surface  Normal: 30s    Incline - Eyes Closed Normal: Stands independently 30s and aligns with gravity    Change in Gait Speed Normal: Significantly changes walkling speed without imbalance    Walk with head turns - Horizontal Normal: performs head turns with no change in gait speed and good balance    Walk with pivot turns Normal: Turns with feet close FAST (< 3 steps) with good balance.    Step over obstacles Normal: Able to step over box with minimal change of gait speed and with good balance.    Timed UP & GO with Dual Task Normal: No noticeable change in sitting, standing or walking while backward counting when compared to TUG without    Mini-BEST total score 26          NMR: On rockerboard in A/P direction: Step ups to rockerboard Alternating SLS taps to 2 cones, 2 sets x 12 total, challenging, need mid-mod assist on some LOB On air ex: Multidirectional stepping in all directions on foam pad Multidirectional stepping according to  single time call out Multidirectional stepping according to double time call out Multidirectional stepping according to triple time call out, improved with repetition *CGA throughout, some LOB with rocker board and mod assist needed to recover anterior LOB  PATIENT EDUCATION: Education details: Continue HEP Person educated: Patient Education method: Explanation, Demonstration, Verbal cues, and Handouts Education comprehension: verbalized understanding and returned demonstration  HOME EXERCISE PROGRAM: Standing PWR Moves  Standing marching at countertop   GOALS: Goals reviewed with patient? Yes  SHORT TERM GOALS: ALL STGS = LTGS   LONG TERM GOALS: Target date: 10/02/2024 UPDATED GOAL DATE TO MATCH CERT: 87/8/74  Pt will be independent with final HEP for PD specific deficits in order to build upon functional gains made in therapy. Baseline:  Goal status:  ON-GOING   2.  Pt will improve miniBEST to at least a 25/28 in order to demo decr fall risk.  Baseline: 22/28 10/09/2024: 26/28 Goal status: MET   3.  Pt will improve gait speed with no AD to at least 3.9 ft/sec in order to demo improved gait efficiency.   Baseline: 9.1 seconds = 3.60 ft/sec   10/09/2024: 7.07 seconds = 4.6 ft/sec Goal status: MET   ASSESSMENT:  CLINICAL IMPRESSION: Today's skilled session focused on goal assessment and continuation of challenging dynamic balance tasks. Patient states their foot and ankle pain is a lot better and he feels PT and HEP is helpful. Patient demonstrated improvements in compensatory stepping in all directions and ability to change gait speed during ambulation and successfully completing head turns with ambulation. Improved gait speed and minibest assessment demonstrate decreased risk of falls. Patient continues to have difficulty with rising to toes with full range of motion and standing on one leg. Duration was increased for rise of toes and single leg stance, but still falls  within ranges for increased risk of falls and will continue to be addressed. Patient continues to benefit from skilled physical therapy in order improve above mentioned deficits and reduce risk of falls. Will continue per POC.      OBJECTIVE IMPAIRMENTS: Abnormal gait, decreased activity tolerance, decreased balance, decreased mobility, difficulty walking, decreased strength, impaired sensation, and pain.   ACTIVITY LIMITATIONS: standing and locomotion level  PARTICIPATION LIMITATIONS: community activity, yard work, and exercising   PERSONAL FACTORS: Past/current experiences, Time since onset of injury/illness/exacerbation, and 1-2 comorbidities: Parkinson's disease, status post L4-S1 decompression on 05/31/2024 for right radiculopathy  are also affecting patient's functional outcome.   REHAB POTENTIAL: Good  CLINICAL DECISION MAKING: Evolving/moderate complexity  EVALUATION COMPLEXITY: Moderate  PLAN:  PT FREQUENCY: 1x/week  PT DURATION: 6 weeks - due to delay in scheduling   PLANNED INTERVENTIONS: 97164- PT Re-evaluation, 97110-Therapeutic exercises, 97530- Therapeutic activity, 97112- Neuromuscular re-education, 97535- Self Care, 02859- Manual therapy, [phone]- Gait training, Patient/Family education, and Balance training  PLAN FOR NEXT SESSION: how is R foot doing? Did pt get more imaging or testing done? Try SciFit, work on balance as able with SLS, lateral and posterior stepping    Emmalene Sherry, [place], DPT 10/09/2024, 1:15 PM

## 2024-10-17 ENCOUNTER — Ambulatory Visit: Admitting: Physical Therapy

## 2024-10-17 VITALS — BP 134/84 | HR 66

## 2024-10-17 DIAGNOSIS — M79661 Pain in right lower leg: Secondary | ICD-10-CM | POA: Diagnosis present

## 2024-10-17 DIAGNOSIS — R2681 Unsteadiness on feet: Secondary | ICD-10-CM | POA: Diagnosis present

## 2024-10-17 DIAGNOSIS — R2689 Other abnormalities of gait and mobility: Secondary | ICD-10-CM | POA: Insufficient documentation

## 2024-10-17 DIAGNOSIS — R29818 Other symptoms and signs involving the nervous system: Secondary | ICD-10-CM | POA: Insufficient documentation

## 2024-10-17 NOTE — Therapy (Signed)
 OUTPATIENT PHYSICAL THERAPY NEURO RE-CERT   Patient Name: Robert Mcclain MRN: [id number] DOB:December 23, 1960, 63 y.o., male Today's Date: 10/17/2024   PCP: [name], MD   REFERRING PROVIDER: [name], FNP  END OF SESSION:  PT End of Session - 10/17/24 1107     Visit Number 5    Number of Visits 7    Date for Recertification  11/07/24   due to delay in scheduling   Authorization Type [employer]    PT Start Time 1105    PT Stop Time 1148    PT Time Calculation (min) 43 min    Equipment Utilized During Treatment Gait belt    Activity Tolerance Patient tolerated treatment well    Behavior During Therapy WFL for tasks assessed/performed            Past Medical History:  Diagnosis Date   Anxiety    Diabetes (HCC)    GERD (gastroesophageal reflux disease)    Parkinson's disease (HCC)    Past Surgical History:  Procedure Laterality Date   COLONOSCOPY     DECOMPRESSIVE LUMBAR LAMINECTOMY LEVEL 2 N/A 05/31/2024   Procedure: DECOMPRESSIVE LUMBAR LAMINECTOMY LEVEL 2;  Surgeon: Beuford Anes, MD;  Location: [facility] OR;  Service: Orthopedics;  Laterality: N/A;  LUMBAR 4- LUMBAR 5, LUMBAR 5 - SACRUM 1 DECOMPRESSION   LASIK Bilateral    WISDOM TOOTH EXTRACTION     Patient Active Problem List   Diagnosis Date Noted   Adjustment disorder with depressed mood 10/07/2021   Parkinson's disease (HCC) 09/25/2021   Sialorrhea 09/25/2021    ONSET DATE: 08/28/2024  REFERRING DIAG: G20.A1 (ICD-10-CM) - Parkinson's disease without dyskinesia, without mention of fluctuations M54.41 (ICD-10-CM) - Lumbago with sciatica, right side G89.29 (ICD-10-CM) - Other chronic pain  THERAPY DIAG:  Other abnormalities of gait and mobility  Unsteadiness on feet  Other symptoms and signs involving the nervous system  Pain in right lower leg  Rationale for Evaluation and Treatment: Rehabilitation  SUBJECTIVE:                                                                                                                                                                                              SUBJECTIVE STATEMENT: Patient ambulates into clinic without AD. Patient states that his ankle feels better each day and that he has an appointment about his ankle on Friday. Patient denies falls.  Pt accompanied by: self  PERTINENT HISTORY: PMH: Parkinson's disease, status post L4-S1 decompression on 05/31/2024 for right radiculopathy   Pt with worsening mobility and pain following back surgery  Persistent right foot and sciatica pain,  worsens post-therapy.   He has had resolution of his preoperative right leg pain but continued to have pain about the right foot and great toe.   PAIN:  Are you having pain? Yes: NPRS scale: 5/10 Pain location: R ankle/foot  Pain description: Aching, R big toe is burning  Aggravating factors: pushing it too hard, putting weight on his foot   Relieving factors: resting   Notes back pain is a 2-3/10 today   Vitals:   10/17/24 1118  BP: 134/84  Pulse: 66     PRECAUTIONS: None Pt reports he has been released from back precautions   FALLS: Has patient fallen in last 6 months? 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  DIAGNOSTIC FINDINGS: Lumbar spine x-ray 06/11/23: IMPRESSION: Intraoperative lateral views of the lumbar spine demonstrate needle localization of L3 and L5 and subsequently retracting instruments over L5-S1.    COGNITION: Overall cognitive status: Within functional limits for tasks assessed   SENSATION: Light touch: WFL Pt reports some tingling in R foot   COORDINATION: Heel to shin: WNL    POSTURE: rounded shoulders    LOWER  EXTREMITY MMT:    MMT Right Eval Left Eval  Hip flexion 5 5  Hip extension    Hip abduction 4 4  Hip adduction 5 5  Hip internal rotation    Hip external rotation    Knee flexion 4+ 5  Knee extension 5 4+  Ankle dorsiflexion 5 5  Ankle plantarflexion    Ankle inversion    Ankle eversion    (Blank rows = not tested)  All tested in sitting   BED MOBILITY:  Pt reports some pain, but no difficulties   TRANSFERS: Sit to stand: Complete Independence  Assistive device utilized: None     Stand to sit: Complete Independence  Assistive device utilized: None       GAIT: Findings: Gait Characteristics: step through pattern, decreased arm swing- Right, decreased arm swing- Left, decreased stance time- Right, decreased stride length, and antalgic, Distance walked: Clinic distances , Assistive device utilized:None, Level of assistance: Modified independence, and Comments: Decr stance time RLE with a limp due to pt's R foot pain, more decr arm swing noted LUE>RUE    FUNCTIONAL TESTS:  5 times sit to stand: 11.3 seconds with no UE support  Timed up and go (TUG): 8.8 seconds  Cog TUG: 11.7 seconds (starting at 77 and counting backwards by 3) 10 meter walk test: 9.1 seconds = 3.60 ft/sec  TREATMENT DATE: 10/17/24  Therapeutic Activity:   Vitals:   10/17/24 1118  BP: 134/84  Pulse: 66    Reviewed HEP and discussion of plan moving forward. Patient states feeling 85% better since starting PT and would like to continue with more session because he states his foot feels better and his balance feels better.  30 sec sit to stand: 11 reps, SBA ABC scale: 79% Patient states he feels 85% better overall since starting PT and that he feels about 80% confident in his balance overall  NMR: Single leg balance x 1 min accumulation each side Right leg: 3 seconds Left  leg: 6 seconds Calf raises x 15 reps Heel raises x 15 reps Calf raises on airex x 15 reps, challenging, limited range of motion and hard to push up on toes Marching on airex x 20 total reps, challenging CGA, no assistance needed PWR moves, squat, side stepping and reaching x 15 each *Patient said no pain during exercise today, CGA and SBA throughout with only some LOB during single leg stance and marching on foam  PATIENT EDUCATION: Education details: Continue HEP, see above Person educated: Patient Education method: Explanation, Demonstration, Verbal cues, and Handouts Education comprehension: verbalized understanding and returned demonstration  HOME EXERCISE PROGRAM: Standing PWR Moves  Standing marching at countertop   GOALS: Goals reviewed with patient? Yes  SHORT TERM GOALS: ALL STGS = LTGS   LONG TERM GOALS: Target date: 10/02/2024 UPDATED GOAL DATE TO MATCH CERT: 87/8/74  Pt will be independent with final HEP for PD specific deficits in order to build upon functional gains made in therapy. Baseline:  12/2/205: MET Goal status: ON-GOING   2.  Pt will improve miniBEST to at least a 25/28 in order to demo decr fall risk.  Baseline: 22/28 10/09/2024: 26/28 Goal status: MET  3.  Pt will improve gait speed with no AD to at least 3.9 ft/sec in order to demo improved gait efficiency.   Baseline: 9.1 seconds = 3.60 ft/sec   10/09/2024: 7.07 seconds = 4.6 ft/sec Goal status: MET  NEW LONG TERM GOALS: 11/07/2024  Pt will improve ABC scale from 79% to at least 85% in order to demo decr fall risk and fear of falling.  Baseline: 79% Goal status: NEW  2.  Pt will improve 30 second sit to stand from 11 reps to at least 13 reps in order to demonstrate increase LE strength and functional ability.   Baseline: 11 reps   Goal status: NEW  3.  Pt will improve single leg stance to at least 8 seconds bilaterally in order to demonstrate improved balance and decreased risk of  falls.   Baseline: 3 seconds R and 6 seconds L   Goal status: NEW  ASSESSMENT:  CLINICAL IMPRESSION: Today's skilled session focused on assessment of confidence of balance and neuromuscular reeducation for single leg tasks and strengthening. Patient continues to state ankle feels better and better and movement helps. Reviewed HEP and discussion of plan moving forward. Patient states feeling 85% better since starting PT and would like to continue with more session because he states his foot feels better and his balance feels more steady. Patient states he feels about 80% confident in his balance overall. He continues to need CGA during balance tasks, especially single leg tasks and would benefit from continued single leg strengthening and neuromuscular reeducation. He would like to continue PT and address single leg balance and strength as he feels this has been helping his balance and  pain a lot.  Patient continues to benefit from skilled physical therapy in order improve above mentioned deficits and reduce risk of falls. Will continue per POC.    OBJECTIVE IMPAIRMENTS: Abnormal gait, decreased activity tolerance, decreased balance, decreased mobility, difficulty walking, decreased strength, impaired sensation, and pain.   ACTIVITY LIMITATIONS: standing and locomotion level  PARTICIPATION LIMITATIONS: community activity, yard work, and exercising   PERSONAL FACTORS: Past/current experiences, Time since onset of injury/illness/exacerbation, and 1-2 comorbidities: Parkinson's disease, status post L4-S1 decompression on 05/31/2024 for right radiculopathy  are also affecting patient's functional outcome.   REHAB POTENTIAL: Good  CLINICAL DECISION MAKING: Evolving/moderate complexity  EVALUATION COMPLEXITY: Moderate  PLAN:  PT FREQUENCY: 1x/week  PT DURATION: 6 weeks - due to delay in scheduling   PLANNED INTERVENTIONS: 97164- PT Re-evaluation, 97110-Therapeutic exercises, 97530- Therapeutic  activity, 97112- Neuromuscular re-education, 97535- Self Care, 02859- Manual therapy, [phone]- Gait training, Patient/Family education, and Balance training  PLAN FOR NEXT SESSION: how is R foot doing? Did pt get more imaging or testing done? Try SciFit, work on balance as able with SLS, lateral and posterior stepping   Continue to add to HEP, ask how appointment went for foot, continue with single leg balance tasks, LE strengthening, and ankle strengthening   Emmalene Sherry, Student-PT, DPT 10/17/2024, 7:12 PM

## 2024-10-30 ENCOUNTER — Encounter: Payer: Self-pay | Admitting: Physical Therapy

## 2024-10-30 ENCOUNTER — Ambulatory Visit: Admitting: Physical Therapy

## 2024-10-30 VITALS — BP 154/81 | HR 56

## 2024-10-30 DIAGNOSIS — R2681 Unsteadiness on feet: Secondary | ICD-10-CM

## 2024-10-30 DIAGNOSIS — M79661 Pain in right lower leg: Secondary | ICD-10-CM

## 2024-10-30 DIAGNOSIS — R2689 Other abnormalities of gait and mobility: Secondary | ICD-10-CM | POA: Diagnosis not present

## 2024-10-30 NOTE — Therapy (Signed)
 OUTPATIENT PHYSICAL THERAPY NEURO TREATMENT   Patient Name: Robert Mcclain MRN: [id number] DOB:1961/10/08, 63 y.o., male Today's Date: 10/30/2024   PCP: [name], MD   REFERRING PROVIDER: [name], FNP  END OF SESSION:  PT End of Session - 10/30/24 1016     Visit Number 6    Number of Visits 7    Date for Recertification  11/07/24   due to delay in scheduling   Authorization Type [employer]    PT Start Time 1015    PT Stop Time 1057    PT Time Calculation (min) 42 min    Equipment Utilized During Treatment Gait belt    Activity Tolerance Patient tolerated treatment well    Behavior During Therapy WFL for tasks assessed/performed            Past Medical History:  Diagnosis Date   Anxiety    Diabetes (HCC)    GERD (gastroesophageal reflux disease)    Parkinson's disease (HCC)    Past Surgical History:  Procedure Laterality Date   COLONOSCOPY     DECOMPRESSIVE LUMBAR LAMINECTOMY LEVEL 2 N/A 05/31/2024   Procedure: DECOMPRESSIVE LUMBAR LAMINECTOMY LEVEL 2;  Surgeon: Beuford Anes, MD;  Location: [facility] OR;  Service: Orthopedics;  Laterality: N/A;  LUMBAR 4- LUMBAR 5, LUMBAR 5 - SACRUM 1 DECOMPRESSION   LASIK Bilateral    WISDOM TOOTH EXTRACTION     Patient Active Problem List   Diagnosis Date Noted   Adjustment disorder with depressed mood 10/07/2021   Parkinson's disease (HCC) 09/25/2021   Sialorrhea 09/25/2021    ONSET DATE: 08/28/2024  REFERRING DIAG: G20.A1 (ICD-10-CM) - Parkinson's disease without dyskinesia, without mention of fluctuations M54.41 (ICD-10-CM) - Lumbago with sciatica, right side G89.29 (ICD-10-CM) - Other chronic pain  THERAPY DIAG:  Unsteadiness on feet  Other abnormalities of gait and mobility  Pain in right lower leg  Rationale for Evaluation and Treatment: Rehabilitation  SUBJECTIVE:                                                                                                                                                                                              SUBJECTIVE STATEMENT: Saw the doctor recently about the results of his vascular study and that was normal. Reports will be getting a nerve block coming up to see if that will help the symptoms. No falls. Has not yet gotten his recumbent bike set up, planning on doing that in the next few days.   Pt accompanied by: self  PERTINENT HISTORY: PMH: Parkinson's disease, status post L4-S1 decompression on 05/31/2024 for right radiculopathy   Pt  with worsening mobility and pain following back surgery  Persistent right foot and sciatica pain, worsens post-therapy.   He has had resolution of his preoperative right leg pain but continued to have pain about the right foot and great toe.   PAIN:  Are you having pain? Yes: NPRS scale: 2/10 Pain location: R ankle/foot  Pain description: Aching, R big toe is burning  Aggravating factors: pushing it too hard, putting weight on his foot   Relieving factors: resting   Vitals:   10/30/24 1024  BP: (!) 154/81  Pulse: (!) 56     PRECAUTIONS: None Pt reports he has been released from back precautions   FALLS: Has patient fallen in last 6 months? 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  DIAGNOSTIC FINDINGS: Lumbar spine x-ray 06/11/23: IMPRESSION: Intraoperative lateral views of the lumbar spine demonstrate needle localization of L3 and L5 and subsequently retracting instruments over L5-S1.    COGNITION: Overall cognitive status: Within functional limits for tasks assessed   SENSATION: Light touch: WFL Pt reports some tingling in R foot   COORDINATION: Heel to shin: WNL    POSTURE: rounded  shoulders    LOWER EXTREMITY MMT:    MMT Right Eval Left Eval  Hip flexion 5 5  Hip extension    Hip abduction 4 4  Hip adduction 5 5  Hip internal rotation    Hip external rotation    Knee flexion 4+ 5  Knee extension 5 4+  Ankle dorsiflexion 5 5  Ankle plantarflexion    Ankle inversion    Ankle eversion    (Blank rows = not tested)  All tested in sitting   BED MOBILITY:  Pt reports some pain, but no difficulties   TRANSFERS: Sit to stand: Complete Independence  Assistive device utilized: None     Stand to sit: Complete Independence  Assistive device utilized: None       GAIT: Findings: Gait Characteristics: step through pattern, decreased arm swing- Right, decreased arm swing- Left, decreased stance time- Right, decreased stride length, and antalgic, Distance walked: Clinic distances , Assistive device utilized:None, Level of assistance: Modified independence, and Comments: Decr stance time RLE with a limp due to pt's R foot pain, more decr arm swing noted LUE>RUE    FUNCTIONAL TESTS:  5 times sit to stand: 11.3 seconds with no UE support  Timed up and go (TUG): 8.8 seconds  Cog TUG: 11.7 seconds (starting at 77 and counting backwards by 3) 10 meter walk test: 9.1 seconds = 3.60 ft/sec  TREATMENT DATE: 10/30/24  Therapeutic Activity:   Vitals:   10/30/24 1024  BP: (!) 154/81  Pulse: (!) 56   Assessed BP and WFL for therapy   SciFit with BUE/BLE Multi-Peaks at Gear 5.0 for 8 minutes for reciprocal movement patterns, larger amplitude movements, neural priming. Pt reporting RPE as 5/10.  NMR: On red mat on floor (pt able to get down and up from floor mod I with pressing up as needed from mat table): Quadruped: alternating hip extensions 10 reps, bird dogs 2 x 10 reps, cues for slowed pace for control and core activation Half kneel  position: 10 reps trunk rotations each side with arms extended out in front of him  10 reps diagonal chops each side Pt more challenged with RLE anteriorly  On BOSU: On blue side: static stance for use of ankle strategy 3 x 30 seconds  On black side: A/P weight shifting 15 reps, pt more challenged with shifting weight anteriorly onto toes    PATIENT EDUCATION: Education details: Additions to HEP for core stability/balance, plan to D/C at next session Person educated: Patient Education method: Explanation, Demonstration, Verbal cues, and Handouts Education comprehension: verbalized understanding and returned demonstration  HOME EXERCISE PROGRAM: Standing PWR Moves  Standing marching at [employer] Code: [facility] URL: [email or web address] Date: 10/30/2024 Prepared by: Sheffield Senate  Exercises - Half Kneeling Diagonal Chops with Medicine Ball  - 1 x daily - 5 x weekly - 2 sets - 10 reps - Bird Dog  - 1 x daily - 5 x weekly - 2 sets - 10 reps  GOALS: Goals reviewed with patient? Yes  SHORT TERM GOALS: ALL STGS = LTGS   LONG TERM GOALS: Target date: 10/02/2024 UPDATED GOAL DATE TO MATCH CERT: 87/8/74  Pt will be independent with final HEP for PD specific deficits in order to build upon functional gains made in therapy. Baseline:  12/2/205: MET Goal status: ON-GOING   2.  Pt will improve miniBEST to at least a 25/28 in order to demo decr fall risk.  Baseline: 22/28 10/09/2024: 26/28 Goal status: MET  3.  Pt will improve gait speed with no AD to at least 3.9 ft/sec in order to demo improved gait efficiency.   Baseline: 9.1 seconds = 3.60 ft/sec   10/09/2024: 7.07 seconds = 4.6 ft/sec Goal status: MET  NEW LONG TERM GOALS: 11/07/2024  Pt will improve ABC scale from 79% to at least 85% in order to demo decr fall risk and fear of falling.  Baseline: 79% Goal status: NEW  2.  Pt will improve 30 second sit to stand from 11 reps to at least 13  reps in order to demonstrate increase LE strength and functional ability.   Baseline: 11 reps   Goal status: NEW  3.  Pt will improve single leg stance to at least 8 seconds bilaterally in order to demonstrate improved balance and decreased risk of falls.   Baseline: 3 seconds R and 6 seconds L   Goal status: NEW  ASSESSMENT:  CLINICAL IMPRESSION: Today's skilled session focused on standing balance tasks on BOSU for ankle strategy, Scifit for ROM/reciprocal movement patterns, and working in half kneel/quadruped positions for balance/core stability. Added bird dogs and half kneeling tasks to pt's HEP to work on core strengthening as pt reporting not doing any core specific exercises at home from seeing the PT previously for his back. Pt initially report less foot pain, but pt reports it did incr slightly after activity,  but pt still able to tolerate session well. Will continue per POC.    OBJECTIVE IMPAIRMENTS: Abnormal gait, decreased activity tolerance, decreased balance, decreased mobility, difficulty walking, decreased strength, impaired sensation, and pain.   ACTIVITY LIMITATIONS: standing and locomotion level  PARTICIPATION LIMITATIONS: community activity, yard work, and exercising   PERSONAL FACTORS: Past/current experiences, Time since onset of injury/illness/exacerbation, and 1-2 comorbidities: Parkinson's disease, status post L4-S1 decompression on 05/31/2024 for right radiculopathy  are also affecting patient's functional outcome.   REHAB POTENTIAL: Good  CLINICAL DECISION MAKING: Evolving/moderate complexity  EVALUATION COMPLEXITY: Moderate  PLAN:  PT FREQUENCY: 1x/week  PT DURATION: 3 weeks  PLANNED INTERVENTIONS: 97164- PT Re-evaluation, 97110-Therapeutic exercises, 97530- Therapeutic activity, [id number]- Neuromuscular re-education, 97535- Self Care, 02859- Manual therapy, [phone]- Gait training, Patient/Family education, and Balance training  PLAN FOR NEXT SESSION: how is  R foot doing? Did pt get more imaging or testing done? Try standing PWR moves on compliant surface, keep doing BOSU for balance Check LTGs, plan for D/C   She[place] LOISE Senate, PT, DPT 10/30/2024, 11:56 AM

## 2024-11-06 ENCOUNTER — Encounter: Payer: Self-pay | Admitting: Physical Therapy

## 2024-11-06 ENCOUNTER — Ambulatory Visit: Admitting: Physical Therapy

## 2024-11-06 DIAGNOSIS — R2689 Other abnormalities of gait and mobility: Secondary | ICD-10-CM | POA: Diagnosis not present

## 2024-11-06 DIAGNOSIS — R2681 Unsteadiness on feet: Secondary | ICD-10-CM

## 2024-11-06 DIAGNOSIS — M79661 Pain in right lower leg: Secondary | ICD-10-CM

## 2024-11-06 NOTE — Therapy (Signed)
 " OUTPATIENT PHYSICAL THERAPY NEURO TREATMENT/DISCHARGE SUMMARY   Patient Name: Robert Mcclain MRN: [id number] DOB:1961/01/26, 63 y.o., male Today's Date: 11/06/2024   PCP: Robert Mcclain   REFERRING PROVIDER: [name], Mcclain  PHYSICAL THERAPY DISCHARGE SUMMARY  Visits from Start of Care: 7  Current functional level related to goals / functional outcomes: See LTGs/Clinical Assessment Statement   Remaining deficits: Antalgic gait, R foot pain, decr SLS time RLE>LLE   Education / Equipment: HEP   Patient agrees to discharge. Patient goals were met. Patient is being discharged due to meeting the stated rehab goals.   END OF SESSION:  PT End of Session - 11/06/24 1107     Visit Number 7    Number of Visits 7    Date for Recertification  11/07/24   due to delay in scheduling   Authorization Type [employer]    PT Start Time 1105    PT Stop Time 1143    PT Time Calculation (min) 38 min    Equipment Utilized During Treatment --    Activity Tolerance Patient tolerated treatment well    Behavior During Therapy WFL for tasks assessed/performed            Past Medical History:  Diagnosis Date   Anxiety    Diabetes (HCC)    GERD (gastroesophageal reflux disease)    Parkinson's disease (HCC)    Past Surgical History:  Procedure Laterality Date   COLONOSCOPY     DECOMPRESSIVE LUMBAR LAMINECTOMY LEVEL 2 N/A 05/31/2024   Procedure: DECOMPRESSIVE LUMBAR LAMINECTOMY LEVEL 2;  Surgeon: Robert Mcclain;  Location: [facility] OR;  Service: Orthopedics;  Laterality: N/A;  LUMBAR 4- LUMBAR 5, LUMBAR 5 - SACRUM 1 DECOMPRESSION   LASIK Bilateral    WISDOM TOOTH EXTRACTION     Patient Active Problem List   Diagnosis Date Noted   Adjustment disorder with depressed mood 10/07/2021   Parkinson's disease (HCC) 09/25/2021   Sialorrhea 09/25/2021    ONSET DATE: 08/28/2024  REFERRING DIAG: G20.A1 (ICD-10-CM) - Parkinson's disease without dyskinesia, without  mention of fluctuations M54.41 (ICD-10-CM) - Lumbago with sciatica, right side G89.29 (ICD-10-CM) - Other chronic pain  THERAPY DIAG:  Unsteadiness on feet  Other abnormalities of gait and mobility  Pain in right lower leg  Rationale for Evaluation and Treatment: Rehabilitation  SUBJECTIVE:                                                                                                                                                                                             SUBJECTIVE STATEMENT: Thinks he overdid it yesterday when he went  on a walk in the neighborhood. No falls. Goes back to [facility] on Jan 5th for a nerve block in his R foot. Still waiting on getting his bike put together.   Pt accompanied by: self  PERTINENT HISTORY: PMH: Parkinson's disease, status post L4-S1 decompression on 05/31/2024 for right radiculopathy   Pt with worsening mobility and pain following back surgery  Persistent right foot and sciatica pain, worsens post-therapy.   He has had resolution of his preoperative right leg pain but continued to have pain about the right foot and great toe.   PAIN:  Are you having pain? Yes: NPRS scale: 3.5/10 Pain location: R ankle/foot  Pain description: Aching, R big toe is burning  Aggravating factors: pushing it too hard, putting weight on his foot   Relieving factors: resting   There were no vitals filed for this visit.    PRECAUTIONS: None Pt reports he has been released from back precautions   FALLS: Has patient fallen in last 6 months? 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  DIAGNOSTIC FINDINGS: Lumbar spine x-ray 06/11/23: IMPRESSION: Intraoperative lateral views of  the lumbar spine demonstrate needle localization of L3 and L5 and subsequently retracting instruments over L5-S1.    COGNITION: Overall cognitive status: Within functional limits for tasks assessed   SENSATION: Light touch: WFL Pt reports some tingling in R foot   COORDINATION: Heel to shin: WNL    POSTURE: rounded shoulders    LOWER EXTREMITY MMT:    MMT Right Eval Left Eval  Hip flexion 5 5  Hip extension    Hip abduction 4 4  Hip adduction 5 5  Hip internal rotation    Hip external rotation    Knee flexion 4+ 5  Knee extension 5 4+  Ankle dorsiflexion 5 5  Ankle plantarflexion    Ankle inversion    Ankle eversion    (Blank rows = not tested)  All tested in sitting   BED MOBILITY:  Pt reports some pain, but no difficulties   TRANSFERS: Sit to stand: Complete Independence  Assistive device utilized: None     Stand to sit: Complete Independence  Assistive device utilized: None       GAIT: Findings: Gait Characteristics: step through pattern, decreased arm swing- Right, decreased arm swing- Left, decreased stance time- Right, decreased stride length, and antalgic, Distance walked: Clinic distances , Assistive device utilized:None, Level of assistance: Modified independence, and Comments: Decr stance time RLE with a limp due to pt's R foot pain, more decr arm swing noted LUE>RUE    FUNCTIONAL TESTS:  5 times sit to stand: 11.3 seconds with no UE support  Timed up and go (TUG): 8.8 seconds  Cog TUG: 11.7 seconds (starting at 77 and counting backwards by 3) 10 meter walk test: 9.1 seconds = 3.60 ft/sec  TREATMENT DATE: 11/06/24  Therapeutic Activity:   Discussed D/C from PT at this time with pt in agreement with plan and PD screens in 4 months  Discussed importance of reciprocal bike for aerobic exercise and then pt does not have  to put weight through his R foot, pt reports that he is still working on getting it set up  Education on walking stick with walking program and holding it in LUE to help offload the weight of pt's R foot  Went over shoe wear, reporting doctor wanted him to wear stiffer shoes and is currently wearing Sketchers and that has been Geneticist, Molecular e-mail for Robert Mcclain (PD social worker for [employer]) and for pt to reach out regarding information about updated community PD resources/community fitness   Goal Assessment: ABC Scale: 1400 = 87.5% 30 second chair stand: 13 sit <> stands SLS: LLE: 3 seconds, RLE: 1 second   Measures for future PD screen: 5x sit <> stand: 10.7 seconds with no UE support  Gait speed: 8.44 seconds = 3.88 ft/sec TUG: 7.4 seconds  Performed standing PWR moves 10 reps of each Up, Rock, Twist, and Step on air ex pad for a compliant surface and discussed can use a balance pad at home or cushion, pt reporting it felt better on her ankle afterwards   PATIENT EDUCATION: Education details: continue HEP, D/C, results of goals  Person educated: Patient Education method: Explanation, Demonstration, Verbal cues, and Handouts Education comprehension: verbalized understanding and returned demonstration  HOME EXERCISE PROGRAM: Standing PWR Moves  Standing marching at [employer] Code: [facility] URL: [email or web address] Date: 10/30/2024 Prepared by: Sheffield Senate  Exercises - Half Kneeling Diagonal Chops with Medicine Ball  - 1 x daily - 5 x weekly - 2 sets - 10 reps - Bird Dog  - 1 x daily - 5 x weekly - 2 sets - 10 reps  GOALS: Goals reviewed with patient? Yes  SHORT TERM GOALS: ALL STGS = LTGS   LONG TERM GOALS: Target date: 10/02/2024 UPDATED GOAL DATE TO MATCH CERT: 87/8/74  Pt will be independent with final HEP for PD specific deficits in order to build upon functional gains made in therapy. Baseline:  12/2/205: MET Goal status:  ON-GOING   2.  Pt will improve miniBEST to at least a 25/28 in order to demo decr fall risk.  Baseline: 22/28 10/09/2024: 26/28 Goal status: MET  3.  Pt will improve gait speed with no AD to at least 3.9 ft/sec in order to demo improved gait efficiency.   Baseline: 9.1 seconds = 3.60 ft/sec   10/09/2024: 7.07 seconds = 4.6 ft/sec Goal status: MET  NEW LONG TERM GOALS: 11/07/2024  Pt will improve ABC scale from 79% to at least 85% in order to demo decr fall risk and fear of falling.  Baseline: 79%  87.5% on 12/22  Goal status: MET  2.  Pt will improve 30 second sit to stand from 11 reps to at least 13 reps in order to demonstrate increase LE strength and functional ability.   Baseline: 11 reps    13 sit <> stands on 12/22 Goal status: MET  3.  Pt will improve single leg stance to at least 8 seconds bilaterally in order to demonstrate improved balance and decreased risk of falls.   Baseline: 3 seconds R and 6 seconds L     LLE: 3 seconds, RLE: 1 second (12/22) Goal status: NOT MET  ASSESSMENT:  CLINICAL IMPRESSION: Today's skilled  session focused on assessing LTGs for anticipated D/C. Pt has met LTGs #1 and #2 in regards to 30 second chair stand and ABC scale for incr confidence with balance. From previous LTG assessment, pt met miniBEST score at 26/28. Pt did not meet LTG #3 in regards to SLS stability, esp with RLE due to on-going foot pain. Pt continues to see the orthopedic doctor regarding this and they are unsure why he continues to have foot pain after back surgery. Pt has had imaging and nerve conduction/vascular studies and everything has been normal. Pt will be getting a nerve block at the beginning of January. Pt has been working on his HEP and does report he feels like his balance is better. Pt will be discharged at this time with plan for PD screens in 6 months. Will continue per POC.    OBJECTIVE IMPAIRMENTS: Abnormal gait, decreased activity tolerance, decreased  balance, decreased mobility, difficulty walking, decreased strength, impaired sensation, and pain.   ACTIVITY LIMITATIONS: standing and locomotion level  PARTICIPATION LIMITATIONS: community activity, yard work, and exercising   PERSONAL FACTORS: Past/current experiences, Time since onset of injury/illness/exacerbation, and 1-2 comorbidities: Parkinson's disease, status post L4-S1 decompression on 05/31/2024 for right radiculopathy  are also affecting patient's functional outcome.   REHAB POTENTIAL: Good  CLINICAL DECISION MAKING: Evolving/moderate complexity  EVALUATION COMPLEXITY: Moderate  PLAN:  PT FREQUENCY: 1x/week  PT DURATION: 3 weeks  PLANNED INTERVENTIONS: [id number]- PT Re-evaluation, 97110-Therapeutic exercises, 97530- Therapeutic activity, [id number]- Neuromuscular re-education, 97535- Self Care, [id number]- Manual therapy, [id number]- Gait training, Patient/Family education, and Balance training  PLAN FOR NEXT SESSION: D/C   Sheffield LOISE Senate, PT, DPT 11/06/2024, 11:51 AM        "

## 2025-03-13 ENCOUNTER — Ambulatory Visit: Admitting: Physical Therapy

## 2025-03-13 ENCOUNTER — Ambulatory Visit: Admitting: Occupational Therapy
